# Patient Record
Sex: Female | Born: 1935 | Race: Black or African American | Hispanic: No | Marital: Single | Smoking: Never smoker
Health system: Southern US, Community
[De-identification: ages and names within clinical notes are randomized; demographics above are authoritative.]

## PROBLEM LIST (undated history)

## (undated) DIAGNOSIS — M40209 Unspecified kyphosis, site unspecified: Secondary | ICD-10-CM

## (undated) DIAGNOSIS — F0150 Vascular dementia without behavioral disturbance: Secondary | ICD-10-CM

## (undated) DIAGNOSIS — F0280 Dementia in other diseases classified elsewhere without behavioral disturbance: Secondary | ICD-10-CM

## (undated) DIAGNOSIS — K5900 Constipation, unspecified: Secondary | ICD-10-CM

## (undated) DIAGNOSIS — E559 Vitamin D deficiency, unspecified: Secondary | ICD-10-CM

## (undated) DIAGNOSIS — K5901 Slow transit constipation: Secondary | ICD-10-CM

## (undated) DIAGNOSIS — G309 Alzheimer's disease, unspecified: Secondary | ICD-10-CM

## (undated) HISTORY — DX: Slow transit constipation: K59.01

## (undated) HISTORY — DX: Vascular dementia, unspecified severity, without behavioral disturbance, psychotic disturbance, mood disturbance, and anxiety: F01.50

## (undated) HISTORY — DX: Vitamin D deficiency, unspecified: E55.9

## (undated) HISTORY — DX: Constipation, unspecified: K59.00

---

## 1997-09-04 ENCOUNTER — Emergency Department (HOSPITAL_COMMUNITY): Admission: EM | Admit: 1997-09-04 | Discharge: 1997-09-04 | Payer: Self-pay | Admitting: Emergency Medicine

## 1998-10-26 ENCOUNTER — Encounter: Payer: Self-pay | Admitting: Emergency Medicine

## 1998-10-26 ENCOUNTER — Emergency Department (HOSPITAL_COMMUNITY): Admission: EM | Admit: 1998-10-26 | Discharge: 1998-10-26 | Payer: Self-pay | Admitting: Emergency Medicine

## 1999-04-18 ENCOUNTER — Emergency Department (HOSPITAL_COMMUNITY): Admission: EM | Admit: 1999-04-18 | Discharge: 1999-04-18 | Payer: Self-pay | Admitting: Emergency Medicine

## 1999-04-18 ENCOUNTER — Encounter: Payer: Self-pay | Admitting: Emergency Medicine

## 1999-07-06 ENCOUNTER — Emergency Department (HOSPITAL_COMMUNITY): Admission: EM | Admit: 1999-07-06 | Discharge: 1999-07-06 | Payer: Self-pay | Admitting: Emergency Medicine

## 1999-07-06 ENCOUNTER — Encounter: Payer: Self-pay | Admitting: Emergency Medicine

## 1999-07-07 ENCOUNTER — Ambulatory Visit (HOSPITAL_COMMUNITY): Admission: RE | Admit: 1999-07-07 | Discharge: 1999-07-07 | Payer: Self-pay | Admitting: Emergency Medicine

## 2005-10-15 ENCOUNTER — Emergency Department (HOSPITAL_COMMUNITY): Admission: EM | Admit: 2005-10-15 | Discharge: 2005-10-16 | Payer: Self-pay | Admitting: Emergency Medicine

## 2005-11-17 ENCOUNTER — Emergency Department (HOSPITAL_COMMUNITY): Admission: EM | Admit: 2005-11-17 | Discharge: 2005-11-17 | Payer: Self-pay | Admitting: Emergency Medicine

## 2006-09-26 ENCOUNTER — Emergency Department (HOSPITAL_COMMUNITY): Admission: EM | Admit: 2006-09-26 | Discharge: 2006-09-26 | Payer: Self-pay | Admitting: Emergency Medicine

## 2007-08-10 ENCOUNTER — Emergency Department (HOSPITAL_COMMUNITY): Admission: EM | Admit: 2007-08-10 | Discharge: 2007-08-10 | Payer: Self-pay | Admitting: Emergency Medicine

## 2009-11-07 ENCOUNTER — Emergency Department (HOSPITAL_COMMUNITY): Admission: EM | Admit: 2009-11-07 | Discharge: 2009-11-07 | Payer: Self-pay | Admitting: Emergency Medicine

## 2009-11-08 ENCOUNTER — Emergency Department (HOSPITAL_COMMUNITY): Admission: EM | Admit: 2009-11-08 | Discharge: 2009-11-08 | Payer: Self-pay | Admitting: Emergency Medicine

## 2010-03-18 ENCOUNTER — Emergency Department (HOSPITAL_COMMUNITY)
Admission: EM | Admit: 2010-03-18 | Discharge: 2010-03-18 | Disposition: A | Discharge status: Home / Self Care | Payer: PRIVATE HEALTH INSURANCE | Attending: Emergency Medicine | Admitting: Emergency Medicine

## 2010-03-18 DIAGNOSIS — G309 Alzheimer's disease, unspecified: Secondary | ICD-10-CM | POA: Insufficient documentation

## 2010-03-18 DIAGNOSIS — Y9210 Unspecified residential institution as the place of occurrence of the external cause: Secondary | ICD-10-CM | POA: Insufficient documentation

## 2010-03-18 DIAGNOSIS — W010XXA Fall on same level from slipping, tripping and stumbling without subsequent striking against object, initial encounter: Secondary | ICD-10-CM | POA: Insufficient documentation

## 2010-03-18 DIAGNOSIS — F0280 Dementia in other diseases classified elsewhere without behavioral disturbance: Secondary | ICD-10-CM | POA: Insufficient documentation

## 2010-03-18 DIAGNOSIS — I10 Essential (primary) hypertension: Secondary | ICD-10-CM | POA: Insufficient documentation

## 2010-03-18 DIAGNOSIS — T1490XA Injury, unspecified, initial encounter: Secondary | ICD-10-CM | POA: Insufficient documentation

## 2010-11-04 ENCOUNTER — Emergency Department (HOSPITAL_COMMUNITY): Payer: Medicare Other

## 2010-11-04 ENCOUNTER — Emergency Department (HOSPITAL_COMMUNITY)
Admission: EM | Admit: 2010-11-04 | Discharge: 2010-11-04 | Disposition: A | Discharge status: Home / Self Care | Payer: Medicare Other | Attending: Emergency Medicine | Admitting: Emergency Medicine

## 2010-11-04 DIAGNOSIS — Z79899 Other long term (current) drug therapy: Secondary | ICD-10-CM | POA: Insufficient documentation

## 2010-11-04 DIAGNOSIS — E860 Dehydration: Secondary | ICD-10-CM | POA: Insufficient documentation

## 2010-11-04 DIAGNOSIS — F0280 Dementia in other diseases classified elsewhere without behavioral disturbance: Secondary | ICD-10-CM | POA: Insufficient documentation

## 2010-11-04 DIAGNOSIS — G309 Alzheimer's disease, unspecified: Secondary | ICD-10-CM | POA: Insufficient documentation

## 2010-11-04 DIAGNOSIS — M549 Dorsalgia, unspecified: Secondary | ICD-10-CM | POA: Insufficient documentation

## 2010-11-04 DIAGNOSIS — M542 Cervicalgia: Secondary | ICD-10-CM | POA: Insufficient documentation

## 2010-11-04 DIAGNOSIS — N390 Urinary tract infection, site not specified: Secondary | ICD-10-CM | POA: Insufficient documentation

## 2010-11-04 LAB — CULTURE, BLOOD (ROUTINE X 2): Culture: NO GROWTH

## 2010-11-04 LAB — DIFFERENTIAL
Basophils Absolute: 0
Basophils Relative: 1
Basophils Relative: 0 % (ref 0–1)
Eosinophils Absolute: 0.1
Eosinophils Absolute: 0 10*3/uL (ref 0.0–0.7)
Eosinophils Relative: 1
Lymphocytes Relative: 19
Monocytes Absolute: 0.5
Monocytes Absolute: 0.2 10*3/uL (ref 0.1–1.0)
Monocytes Relative: 4 % (ref 3–12)

## 2010-11-04 LAB — URINALYSIS, ROUTINE W REFLEX MICROSCOPIC
Glucose, UA: NEGATIVE
Hgb urine dipstick: NEGATIVE
Protein, ur: NEGATIVE
Protein, ur: NEGATIVE mg/dL
Urobilinogen, UA: 1
Urobilinogen, UA: 1 mg/dL (ref 0.0–1.0)

## 2010-11-04 LAB — BASIC METABOLIC PANEL
Calcium: 9.6 mg/dL (ref 8.4–10.5)
GFR calc non Af Amer: 75 mL/min — ABNORMAL LOW (ref >90)
Glucose, Bld: 99 mg/dL (ref 70–99)
Sodium: 138 mEq/L (ref 135–145)

## 2010-11-04 LAB — CBC
HCT: 34.7 — ABNORMAL LOW
Hemoglobin: 11.5 — ABNORMAL LOW
MCH: 25.7 pg — ABNORMAL LOW (ref 26.0–34.0)
MCHC: 33.2
MCHC: 31.5 g/dL (ref 30.0–36.0)
MCV: 78.1
Platelets: 166 10*3/uL (ref 150–400)
RDW: 14.1 — ABNORMAL HIGH

## 2010-11-04 LAB — I-STAT 8, (EC8 V) (CONVERTED LAB)
Glucose, Bld: 86
Potassium: 4.2
TCO2: 28
pH, Ven: 7.378 — ABNORMAL HIGH

## 2010-11-04 LAB — HEPATIC FUNCTION PANEL
ALT: 17
Bilirubin, Direct: 0.2
Indirect Bilirubin: 1.1 — ABNORMAL HIGH

## 2010-11-04 LAB — URINE MICROSCOPIC-ADD ON

## 2010-11-04 LAB — POCT I-STAT CREATININE: Operator id: 234501

## 2010-11-06 LAB — URINE CULTURE
Colony Count: >=100000
Culture  Setup Time: 201210130053

## 2011-03-01 ENCOUNTER — Encounter (HOSPITAL_COMMUNITY): Payer: Self-pay | Admitting: Emergency Medicine

## 2011-03-01 ENCOUNTER — Emergency Department (HOSPITAL_COMMUNITY): Payer: Medicare Other

## 2011-03-01 ENCOUNTER — Emergency Department (HOSPITAL_COMMUNITY)
Admission: EM | Admit: 2011-03-01 | Discharge: 2011-03-01 | Disposition: A | Discharge status: Skilled Nursing Facility | Payer: Medicare Other | Attending: Family Medicine | Admitting: Family Medicine

## 2011-03-01 ENCOUNTER — Other Ambulatory Visit: Payer: Self-pay

## 2011-03-01 DIAGNOSIS — F0280 Dementia in other diseases classified elsewhere without behavioral disturbance: Secondary | ICD-10-CM | POA: Insufficient documentation

## 2011-03-01 DIAGNOSIS — R5383 Other fatigue: Secondary | ICD-10-CM | POA: Insufficient documentation

## 2011-03-01 DIAGNOSIS — W19XXXA Unspecified fall, initial encounter: Secondary | ICD-10-CM

## 2011-03-01 DIAGNOSIS — R5381 Other malaise: Secondary | ICD-10-CM | POA: Insufficient documentation

## 2011-03-01 DIAGNOSIS — G309 Alzheimer's disease, unspecified: Secondary | ICD-10-CM | POA: Insufficient documentation

## 2011-03-01 HISTORY — DX: Dementia in other diseases classified elsewhere, unspecified severity, without behavioral disturbance, psychotic disturbance, mood disturbance, and anxiety: F02.80

## 2011-03-01 HISTORY — DX: Alzheimer's disease, unspecified: G30.9

## 2011-03-01 LAB — CBC
HCT: 35.5 % — ABNORMAL LOW (ref 36.0–46.0)
Hemoglobin: 11.4 g/dL — ABNORMAL LOW (ref 12.0–15.0)
RBC: 4.38 MIL/uL (ref 3.87–5.11)
WBC: 6.9 10*3/uL (ref 4.0–10.5)

## 2011-03-01 LAB — URINALYSIS, ROUTINE W REFLEX MICROSCOPIC
Glucose, UA: NEGATIVE mg/dL
Leukocytes, UA: NEGATIVE
pH: 5.5 (ref 5.0–8.0)

## 2011-03-01 LAB — GLUCOSE, CAPILLARY: Glucose-Capillary: 123 mg/dL — ABNORMAL HIGH (ref 70–99)

## 2011-03-01 LAB — CARDIAC PANEL(CRET KIN+CKTOT+MB+TROPI): Relative Index: 2.5 (ref 0.0–2.5)

## 2011-03-01 LAB — COMPREHENSIVE METABOLIC PANEL
Alkaline Phosphatase: 58 U/L (ref 39–117)
BUN: 20 mg/dL (ref 6–23)
Chloride: 102 mEq/L (ref 96–112)
GFR calc Af Amer: 85 mL/min — ABNORMAL LOW (ref >90)
Glucose, Bld: 118 mg/dL — ABNORMAL HIGH (ref 70–99)
Potassium: 4.2 mEq/L (ref 3.5–5.1)
Total Bilirubin: 0.4 mg/dL (ref 0.3–1.2)

## 2011-03-01 NOTE — ED Notes (Addendum)
Per EMS.  Pt resident of [facility] and pt has Alzheimers.  Staff stated that pt looked like she was weak while walking in the hallway, so staff assisted her to the floor.  When EMS arrived pt was asleep on floor.  Pt does not complain of pain or sob.  Pt only spoke of pocketbook.  Pt has DNR.

## 2011-03-01 NOTE — ED Notes (Signed)
CBG 123 

## 2011-03-01 NOTE — ED Provider Notes (Signed)
History     CSN: [id number]  Arrival date & time 03/01/11  1233   First MD Initiated Contact with Patient 03/01/11 1238      Chief Complaint  Patient presents with  . Weakness   Patient is a 78 y.o. female presenting with weakness. The history is provided by the patient and the EMS personnel.  Weakness The symptoms began 1 to 2 hours ago. The symptoms are unchanged. The neurological symptoms are diffuse.  Additional symptoms include weakness.  The patient was reportedly walking down the hall, and looked weak, so staff helped her to the floor.  She then seemed to be asleep.  EMS was called.    The patient is able to state she feels weak, but is otherwise unable to provide any history.     Past Medical History  Diagnosis Date  . Dementia in Alzheimer's disease     History reviewed. No pertinent past surgical history.on file.  Unable to obtain further history due to dementia.   History reviewed. No pertinent family history. on file.  Unable to obtain further history due to dementia.   History  Substance Use Topics  . Smoking status: Unknown If Ever Smoked  . Smokeless tobacco: Not on file  . Alcohol Use: No    Review of Systems  Neurological: Positive for weakness.  Otherwise unable to obtain due to dementia.   Allergies  Review of patient's allergies indicates no known allergies.  Home Medications  No current outpatient prescriptions on file.  There were no vitals taken for this visit.  Physical Exam  Constitutional: She is easily aroused. No distress.       Patient sitting with head down, appears to be asleep.   HENT:  Head: Normocephalic.  Eyes: EOM are normal. Pupils are equal, round, and reactive to light.  Neck: Normal range of motion. Neck supple.  Cardiovascular: Normal rate, regular rhythm and normal heart sounds.   Pulmonary/Chest: Effort normal and breath sounds normal.  Abdominal: Soft. Bowel sounds are normal.  Musculoskeletal: She exhibits no edema.   Neurological: She is easily aroused. She is disoriented.  No focal neuro deficits.   ED Course  Procedures (including critical care time)  Date: 03/01/2011  Rate: 64  Rhythm: normal sinus rhythm  QRS Axis: normal  Intervals: normal  ST/T Wave abnormalities: nonspecific ST changes  Conduction Disutrbances:none  Narrative Interpretation: NSR, LVH.   Old EKG Reviewed: unchanged   Labs Reviewed  CBC - Abnormal; Notable for the following:    Hemoglobin 11.4 (*)    HCT 35.5 (*)    Platelets 147 (*)    All other components within normal limits  COMPREHENSIVE METABOLIC PANEL - Abnormal; Notable for the following:    Glucose, Bld 118 (*)    GFR calc non Af Amer 73 (*)    GFR calc Af Amer 85 (*)    All other components within normal limits  GLUCOSE, CAPILLARY - Abnormal; Notable for the following:    Glucose-Capillary 123 (*)    All other components within normal limits  URINALYSIS, ROUTINE W REFLEX MICROSCOPIC  CARDIAC PANEL(CRET KIN+CKTOT+MB+TROPI)   Ct Head Wo Contrast  03/01/2011  *RADIOLOGY REPORT*  Clinical Data: Weakness, confusion  CT HEAD WITHOUT CONTRAST  Technique:  Contiguous axial images were obtained from the base of the skull through the vertex without contrast.  Comparison: 10/25/2010  Findings: No skull fracture is noted.  Paranasal sinuses and mastoid air cells are unremarkable.  Stable cerebral atrophy.  Atherosclerotic  calcifications of carotid siphon are again noted.  No intracranial hemorrhage, mass effect or midline shift.  Small lacunar infarct in the left putamen is stable.  Stable periventricular and subcortical chronic white matter decreased attenuation probable due to small cell ischemic changes.  No acute infarction.  No mass lesion is noted on this unenhanced scan.  IMPRESSION: No acute intracranial abnormality.  Stable atrophy and chronic white matter disease.  Original Report Authenticated By: Natasha Mead, M.D.   Dg Chest Port 1 View  03/01/2011  *RADIOLOGY  REPORT*  Clinical Data: Weakness, some confusion  PORTABLE CHEST - 1 VIEW  Comparison: Chest x-ray of 11/04/2010  Findings: No active infiltrate or effusion is seen.  Slightly prominent interstitial markings appear chronic.  The heart is mildly enlarged and stable.  The bones are osteopenic and there are diffuse degenerative changes throughout the thoracic spine.  IMPRESSION: Stable chronic change and cardiomegaly.  No active lung disease.  Original Report Authenticated By: Juline Patch, M.D.     1. Fall       MDM  Patient presents from [facility] Alzheimers unit with weakness, she has severe baseline dementia. - Exam non-focal, CT head negative -CXR and UA, WBC not concerning for infection - EKG unchanged from previous.  -Discharge back to [facility]         Ardyth Gal, MD 03/01/11 1520

## 2011-03-02 NOTE — ED Provider Notes (Signed)
I saw and evaluated the patient, reviewed the resident's note and I agree with the findings and plan.  I saw the patient along with Dr. Lula Olszewski.  I agree with her note, assessment, and plan.  The patient was transported here for evaluation after some sort of fall or syncopal episode at the nursing home.  The physical exam revealed a confused, demented, elderly female in no distress.  She was verbal, but confused.  The heart and lung exam were unremarkable and neurologic exam was non-focal, but limited due to dementia.  The labs were essentially unremarkable and head CT was okay.  We will discharge her back to the ecf.    Geoffery Lyons, MD 03/02/11 1356

## 2011-07-31 ENCOUNTER — Emergency Department (HOSPITAL_COMMUNITY)
Admission: EM | Admit: 2011-07-31 | Discharge: 2011-07-31 | Disposition: A | Discharge status: Skilled Nursing Facility | Payer: Medicare Other | Attending: Emergency Medicine | Admitting: Emergency Medicine

## 2011-07-31 ENCOUNTER — Emergency Department (HOSPITAL_COMMUNITY): Payer: Medicare Other

## 2011-07-31 ENCOUNTER — Encounter (HOSPITAL_COMMUNITY): Payer: Self-pay | Admitting: Emergency Medicine

## 2011-07-31 DIAGNOSIS — Z79899 Other long term (current) drug therapy: Secondary | ICD-10-CM | POA: Insufficient documentation

## 2011-07-31 DIAGNOSIS — W19XXXA Unspecified fall, initial encounter: Secondary | ICD-10-CM

## 2011-07-31 DIAGNOSIS — M546 Pain in thoracic spine: Secondary | ICD-10-CM | POA: Insufficient documentation

## 2011-07-31 DIAGNOSIS — G309 Alzheimer's disease, unspecified: Secondary | ICD-10-CM | POA: Insufficient documentation

## 2011-07-31 DIAGNOSIS — F0280 Dementia in other diseases classified elsewhere without behavioral disturbance: Secondary | ICD-10-CM | POA: Insufficient documentation

## 2011-07-31 DIAGNOSIS — M542 Cervicalgia: Secondary | ICD-10-CM

## 2011-07-31 DIAGNOSIS — W010XXA Fall on same level from slipping, tripping and stumbling without subsequent striking against object, initial encounter: Secondary | ICD-10-CM | POA: Insufficient documentation

## 2011-07-31 NOTE — ED Notes (Signed)
Pt brought in by EMS for [facility] s/p fall while using her walker going to dining room  To eat , Denies dizziness and loc c/o back pain neck pain

## 2011-07-31 NOTE — ED Provider Notes (Signed)
History    89yF with neck pain. Fell while ambulating with walker. Lost balance. Not sure if she hit her head. No LOC. C/o neck and upper back pain. No HA. No acute visual changes. No numbness, tingling or loss of strength. No n/v. No CP. No abdominal pain. No blood thinners. Per report, appears to be at her baseline mental status.  CSN: [id number]  Arrival date & time 07/31/11  1235   First MD Initiated Contact with Patient 07/31/11 1358      Chief Complaint  Patient presents with  . Fall    (Consider location/radiation/quality/duration/timing/severity/associated sxs/prior treatment) HPI  Past Medical History  Diagnosis Date  . Dementia in Alzheimer's disease     No past surgical history on file.  No family history on file.  History  Substance Use Topics  . Smoking status: Unknown If Ever Smoked  . Smokeless tobacco: Not on file  . Alcohol Use: No    OB History    Grav Para Term Preterm Abortions TAB SAB Ect Mult Living                  Review of Systems   Review of symptoms negative unless otherwise noted in HPI.   Allergies  Review of patient's allergies indicates no known allergies.  Home Medications   Current Outpatient Rx  Name Route Sig Dispense Refill  . ACETAMINOPHEN 325 MG PO TABS Oral Take 650 mg by mouth every 6 (six) hours as needed. For pain.    Marland Kitchen DOCUSATE SODIUM 100 MG PO CAPS Oral Take 100 mg by mouth 2 (two) times daily.    . DONEPEZIL HCL 10 MG PO TABS Oral Take 10 mg by mouth at bedtime.    [place] IMMUNE HEALTH PO LIQD Oral Take 237 mLs by mouth 2 (two) times daily.    Marland Kitchen LORATADINE 10 MG PO TABS Oral Take 10 mg by mouth daily.    Marland Kitchen MEMANTINE HCL 10 MG PO TABS Oral Take 10 mg by mouth 2 (two) times daily.    . OLOPATADINE HCL 0.1 % OP SOLN Both Eyes Place 1 drop into both eyes 2 (two) times daily.    Marland Kitchen POLYETHYLENE GLYCOL 3350 PO PACK Oral Take 17 g by mouth daily.    Marland Kitchen VITAMIN D (ERGOCALCIFEROL) 50000 UNITS PO CAPS Oral Take 50,000 Units  by mouth every 30 (thirty) days.      BP 104/62  Pulse 84  Temp 98.7 F (37.1 C) (Oral)  Resp 12  SpO2 100%  Physical Exam  Nursing note and vitals reviewed. Constitutional: No distress.       Laying in bed. NAD. No external signs of trauma.  HENT:  Head: Normocephalic and atraumatic.  Eyes: Conjunctivae are normal. Pupils are equal, round, and reactive to light. Right eye exhibits no discharge. Left eye exhibits no discharge.  Cardiovascular: Normal rate, regular rhythm and normal heart sounds.  Exam reveals no gallop and no friction rub.   No murmur heard. Pulmonary/Chest: Effort normal and breath sounds normal. No respiratory distress.  Abdominal: Soft. She exhibits no distension. There is no tenderness.  Musculoskeletal: She exhibits no edema and no tenderness.       No midline spinal tenderness.  Neurological:       Does follow basic commands. Disoriented to place and time. No deficits from described baseline.  Skin: Skin is warm and dry.  Psychiatric: She has a normal mood and affect. Her behavior is normal. Thought content normal.  ED Course  Procedures (including critical care time)  Labs Reviewed - No data to display Ct Head Wo Contrast  07/31/2011  *RADIOLOGY REPORT*  Clinical Data:  Fall.  Dementia  CT HEAD WITHOUT CONTRAST CT CERVICAL SPINE WITHOUT CONTRAST  Technique:  Multidetector CT imaging of the head and cervical spine was performed following the standard protocol without intravenous contrast.  Multiplanar CT image reconstructions of the cervical spine were also generated.  Comparison:  CT head 03/01/2011  CT HEAD  Findings: Generalized atrophy.  Chronic microvascular ischemia in the white matter.  Chronic lacunar infarction or benign cyst in the left basal ganglia is unchanged.  Negative for acute infarct. Negative for hemorrhage or mass.  Negative for skull fracture. Mild chronic sinusitis.  IMPRESSION: Atrophy and chronic microvascular ischemia.  No acute  abnormality.  CT CERVICAL SPINE  Findings: Schmorl's node in the superior endplate of T1 with mild loss of height.  This appears chronic.  Negative for acute fracture.  Disc degeneration and spondylosis C4-5 and C5-6.  Degenerative changes at C1-2.  Cervical facet degeneration is present.  Mild dextroscoliosis.  IMPRESSION: Negative for acute fracture.  Mild fracture of T1 appears chronic.  Original Report Authenticated By: Camelia Phenes, M.D.   Ct Cervical Spine Wo Contrast  07/31/2011  *RADIOLOGY REPORT*  Clinical Data:  Fall.  Dementia  CT HEAD WITHOUT CONTRAST CT CERVICAL SPINE WITHOUT CONTRAST  Technique:  Multidetector CT imaging of the head and cervical spine was performed following the standard protocol without intravenous contrast.  Multiplanar CT image reconstructions of the cervical spine were also generated.  Comparison:  CT head 03/01/2011  CT HEAD  Findings: Generalized atrophy.  Chronic microvascular ischemia in the white matter.  Chronic lacunar infarction or benign cyst in the left basal ganglia is unchanged.  Negative for acute infarct. Negative for hemorrhage or mass.  Negative for skull fracture. Mild chronic sinusitis.  IMPRESSION: Atrophy and chronic microvascular ischemia.  No acute abnormality.  CT CERVICAL SPINE  Findings: Schmorl's node in the superior endplate of T1 with mild loss of height.  This appears chronic.  Negative for acute fracture.  Disc degeneration and spondylosis C4-5 and C5-6.  Degenerative changes at C1-2.  Cervical facet degeneration is present.  Mild dextroscoliosis.  IMPRESSION: Negative for acute fracture.  Mild fracture of T1 appears chronic.  Original Report Authenticated By: Camelia Phenes, M.D.     1. Fall   2. Neck pain       MDM  89yf s/p fall. Per report, mechanical in nature. Pt seems to be at her baseline. Imaging negative for acute traumatic injury. Pt is from SNF. Feel she is safe for DC back to monitored setting at this time.          Raeford Razor, MD 08/05/11 1606

## 2011-07-31 NOTE — ED Notes (Signed)
Bed:WA25<BR> Expected date:<BR> Expected time:<BR> Means of arrival:<BR> Comments:<BR> fall

## 2011-07-31 NOTE — ED Notes (Signed)
[facility] called for transportation  

## 2011-10-27 ENCOUNTER — Emergency Department (HOSPITAL_COMMUNITY)
Admission: EM | Admit: 2011-10-27 | Discharge: 2011-10-27 | Disposition: A | Discharge status: Skilled Nursing Facility | Payer: Medicare Other | Attending: Emergency Medicine | Admitting: Emergency Medicine

## 2011-10-27 ENCOUNTER — Emergency Department (HOSPITAL_COMMUNITY): Payer: Medicare Other

## 2011-10-27 ENCOUNTER — Encounter (HOSPITAL_COMMUNITY): Payer: Self-pay

## 2011-10-27 DIAGNOSIS — F0280 Dementia in other diseases classified elsewhere without behavioral disturbance: Secondary | ICD-10-CM | POA: Insufficient documentation

## 2011-10-27 DIAGNOSIS — W010XXA Fall on same level from slipping, tripping and stumbling without subsequent striking against object, initial encounter: Secondary | ICD-10-CM | POA: Insufficient documentation

## 2011-10-27 DIAGNOSIS — G309 Alzheimer's disease, unspecified: Secondary | ICD-10-CM | POA: Insufficient documentation

## 2011-10-27 DIAGNOSIS — Y9210 Unspecified residential institution as the place of occurrence of the external cause: Secondary | ICD-10-CM | POA: Insufficient documentation

## 2011-10-27 DIAGNOSIS — M79609 Pain in unspecified limb: Secondary | ICD-10-CM | POA: Insufficient documentation

## 2011-10-27 DIAGNOSIS — Z79899 Other long term (current) drug therapy: Secondary | ICD-10-CM | POA: Insufficient documentation

## 2011-10-27 DIAGNOSIS — S93409A Sprain of unspecified ligament of unspecified ankle, initial encounter: Secondary | ICD-10-CM | POA: Insufficient documentation

## 2011-10-27 NOTE — ED Notes (Signed)
Per ems, pt from nursing home, staff sts pt was walking, had a unwitnessed fall from standing position, leg gave out on her, left foot pain, no neck or back pain, no skin injury.

## 2011-10-27 NOTE — ED Notes (Signed)
[facility] called  

## 2011-10-27 NOTE — ED Provider Notes (Signed)
History     CSN: [id number]  Arrival date & time 10/27/11  1505   First MD Initiated Contact with Patient 10/27/11 1600      Chief Complaint  Patient presents with  . Fall  . Foot Pain    left     (Consider location/radiation/quality/duration/timing/severity/associated sxs/prior treatment) HPI  78 y.o. demented female sent from nursing home for evaluation of the left foot pain after slip and fall earlier in the day. 5 caveat secondary to dementia.   Past Medical History  Diagnosis Date  . Dementia in Alzheimer's disease     No past surgical history on file.  No family history on file.  History  Substance Use Topics  . Smoking status: Unknown If Ever Smoked  . Smokeless tobacco: Not on file  . Alcohol Use: No    OB History    Grav Para Term Preterm Abortions TAB SAB Ect Mult Living                  Review of Systems  Unable to perform ROS: Dementia    Allergies  Review of patient's allergies indicates no known allergies.  Home Medications   Current Outpatient Rx  Name Route Sig Dispense Refill  . ACETAMINOPHEN 325 MG PO TABS Oral Take 650 mg by mouth every 6 (six) hours as needed. For pain.    Marland Kitchen DOCUSATE SODIUM 100 MG PO CAPS Oral Take 100 mg by mouth 2 (two) times daily.    . DONEPEZIL HCL 10 MG PO TABS Oral Take 10 mg by mouth at bedtime.    [place] IMMUNE HEALTH PO LIQD Oral Take 237 mLs by mouth 2 (two) times daily.    Marland Kitchen LORATADINE 10 MG PO TABS Oral Take 10 mg by mouth daily.    Marland Kitchen MEMANTINE HCL 10 MG PO TABS Oral Take 10 mg by mouth 2 (two) times daily.    . OLOPATADINE HCL 0.1 % OP SOLN Both Eyes Place 1 drop into both eyes every evening.     Marland Kitchen POLYETHYLENE GLYCOL 3350 PO PACK Oral Take 17 g by mouth daily.    Marland Kitchen VITAMIN D (ERGOCALCIFEROL) 50000 UNITS PO CAPS Oral Take 50,000 Units by mouth every 30 (thirty) days.      BP 126/70  Pulse 94  Temp 98 F (36.7 C) (Oral)  Resp 16  SpO2 94%  Physical Exam  Nursing note and vitals  reviewed. Constitutional: She appears well-developed and well-nourished. No distress.  HENT:  Head: Normocephalic and atraumatic.  Right Ear: External ear normal.  Mouth/Throat: Oropharynx is clear and moist.  Eyes: Conjunctivae normal and EOM are normal. Pupils are equal, round, and reactive to light.  Neck: Normal range of motion.  Cardiovascular: Normal rate, regular rhythm and intact distal pulses.   Pulmonary/Chest: Effort normal and breath sounds normal. No stridor.  Abdominal: Soft. Bowel sounds are normal. She exhibits no distension and no mass. There is no tenderness. There is no rebound and no guarding.  Musculoskeletal: Normal range of motion.       No deformity noted to left foot. Patient shows no sign of tenderness to deep palpation of all surfaces. Dorsalis pedis 2+ bilaterally and cap refill less than 3 seconds.  Neurological:       Demented intermittent words but essentially nonverbal  Psychiatric: She has a normal mood and affect.    ED Course  Procedures (including critical care time)  Labs Reviewed - No data to display Dg Foot Complete Left  10/27/2011  *RADIOLOGY REPORT*  Clinical Data: Fall, anterior foot pain  LEFT FOOT - COMPLETE 3+ VIEW  Comparison: 11/07/2009  Findings: Three views of the left foot submitted.  No acute fracture or subluxation.  Study is markedly limited by diffuse osteopenia.  Again noted plantar spurring of the calcaneus.  Stable posterior spurring of the calcaneus at the Achilles tendon insertion.  IMPRESSION: No acute fracture or subluxation.  Markedly limited study by diffuse osteopenia.  Stable posterior and plantar spurring of the calcaneus.   Original Report Authenticated By: Natasha Mead, M.D.      1. Ankle sprain       MDM  No signs of trauma on physical exam. X-ray shows no acute bony abnormalities. We'll recommend RICE          Wynetta Emery, PA-C 10/27/11 1654

## 2011-10-27 NOTE — ED Notes (Signed)
Pt transported back with [facility].

## 2011-10-27 NOTE — ED Notes (Signed)
Report given to [facility].

## 2011-10-27 NOTE — ED Notes (Signed)
Pt transported back to [facility] by [employer]

## 2011-10-27 NOTE — ED Notes (Signed)
Received report from [place], Charity fundraiser. Pt waiting on [facility] to transfer her back to NH. Pt sitting in chair. Pt given sandwich and juice.

## 2011-10-31 NOTE — ED Provider Notes (Signed)
Medical screening examination/treatment/procedure(s) were performed by non-physician practitioner and as supervising physician I was immediately available for consultation/collaboration.  Asalee Barrette, MD 10/31/11 2348 

## 2011-11-27 ENCOUNTER — Encounter (HOSPITAL_COMMUNITY): Payer: Self-pay | Admitting: *Deleted

## 2011-11-27 ENCOUNTER — Emergency Department (HOSPITAL_COMMUNITY)
Admission: EM | Admit: 2011-11-27 | Discharge: 2011-11-27 | Disposition: A | Discharge status: Skilled Nursing Facility | Payer: Medicare Other | Attending: Emergency Medicine | Admitting: Emergency Medicine

## 2011-11-27 DIAGNOSIS — Z0000 Encounter for general adult medical examination without abnormal findings: Secondary | ICD-10-CM

## 2011-11-27 DIAGNOSIS — F0280 Dementia in other diseases classified elsewhere without behavioral disturbance: Secondary | ICD-10-CM | POA: Insufficient documentation

## 2011-11-27 DIAGNOSIS — W1789XA Other fall from one level to another, initial encounter: Secondary | ICD-10-CM | POA: Insufficient documentation

## 2011-11-27 DIAGNOSIS — Z043 Encounter for examination and observation following other accident: Secondary | ICD-10-CM | POA: Insufficient documentation

## 2011-11-27 DIAGNOSIS — Z79899 Other long term (current) drug therapy: Secondary | ICD-10-CM | POA: Insufficient documentation

## 2011-11-27 DIAGNOSIS — W19XXXA Unspecified fall, initial encounter: Secondary | ICD-10-CM

## 2011-11-27 DIAGNOSIS — Y9210 Unspecified residential institution as the place of occurrence of the external cause: Secondary | ICD-10-CM | POA: Insufficient documentation

## 2011-11-27 DIAGNOSIS — G309 Alzheimer's disease, unspecified: Secondary | ICD-10-CM | POA: Insufficient documentation

## 2011-11-27 DIAGNOSIS — Y939 Activity, unspecified: Secondary | ICD-10-CM | POA: Insufficient documentation
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
## 2011-11-27 NOTE — ED Notes (Signed)
[facility] called to transport pt back to nursing facility.

## 2011-11-27 NOTE — ED Notes (Signed)
Facility notified of patents return
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
## 2011-11-27 NOTE — ED Notes (Signed)
ZOX:[id number]<AV> Expected date:11/27/11<BR> Expected time:10:05 PM<BR> Means of arrival:Ambulance<BR> Comments:<BR> 78 yo fall

## 2012-02-23 ENCOUNTER — Emergency Department (HOSPITAL_COMMUNITY): Payer: Medicare Other

## 2012-02-23 ENCOUNTER — Emergency Department (HOSPITAL_COMMUNITY)
Admission: EM | Admit: 2012-02-23 | Discharge: 2012-02-23 | Disposition: A | Discharge status: Home / Self Care | Payer: Medicare Other | Attending: Emergency Medicine | Admitting: Emergency Medicine

## 2012-02-23 ENCOUNTER — Encounter (HOSPITAL_COMMUNITY): Payer: Self-pay | Admitting: Emergency Medicine

## 2012-02-23 DIAGNOSIS — R5381 Other malaise: Secondary | ICD-10-CM | POA: Insufficient documentation

## 2012-02-23 DIAGNOSIS — F0280 Dementia in other diseases classified elsewhere without behavioral disturbance: Secondary | ICD-10-CM | POA: Insufficient documentation

## 2012-02-23 DIAGNOSIS — R531 Weakness: Secondary | ICD-10-CM

## 2012-02-23 DIAGNOSIS — G309 Alzheimer's disease, unspecified: Secondary | ICD-10-CM | POA: Insufficient documentation

## 2012-02-23 DIAGNOSIS — R5383 Other fatigue: Secondary | ICD-10-CM | POA: Insufficient documentation

## 2012-02-23 DIAGNOSIS — Z79899 Other long term (current) drug therapy: Secondary | ICD-10-CM | POA: Insufficient documentation

## 2012-02-23 DIAGNOSIS — R630 Anorexia: Secondary | ICD-10-CM | POA: Insufficient documentation

## 2012-02-23 LAB — CBC WITH DIFFERENTIAL/PLATELET
Basophils Relative: 0 % (ref 0–1)
Eosinophils Absolute: 0 10*3/uL (ref 0.0–0.7)
Eosinophils Relative: 1 % (ref 0–5)
HCT: 33 % — ABNORMAL LOW (ref 36.0–46.0)
Hemoglobin: 10.4 g/dL — ABNORMAL LOW (ref 12.0–15.0)
Lymphs Abs: 1.3 10*3/uL (ref 0.7–4.0)
MCH: 26.5 pg (ref 26.0–34.0)
MCHC: 31.5 g/dL (ref 30.0–36.0)
MCV: 84.2 fL (ref 78.0–100.0)
Monocytes Absolute: 0.3 10*3/uL (ref 0.1–1.0)
Monocytes Relative: 10 % (ref 3–12)
Neutrophils Relative %: 50 % (ref 43–77)
RBC: 3.92 MIL/uL (ref 3.87–5.11)

## 2012-02-23 LAB — COMPREHENSIVE METABOLIC PANEL
Alkaline Phosphatase: 58 U/L (ref 39–117)
BUN: 24 mg/dL — ABNORMAL HIGH (ref 6–23)
Creatinine, Ser: 0.77 mg/dL (ref 0.50–1.10)
GFR calc Af Amer: 83 mL/min — ABNORMAL LOW (ref >90)
Glucose, Bld: 70 mg/dL (ref 70–99)
Potassium: 4.2 mEq/L (ref 3.5–5.1)
Total Protein: 6.7 g/dL (ref 6.0–8.3)

## 2012-02-23 LAB — URINALYSIS, ROUTINE W REFLEX MICROSCOPIC
Ketones, ur: NEGATIVE mg/dL
Leukocytes, UA: NEGATIVE
Nitrite: NEGATIVE
Protein, ur: NEGATIVE mg/dL
pH: 6 (ref 5.0–8.0)

## 2012-02-23 LAB — POCT I-STAT TROPONIN I: Troponin i, poc: 0 ng/mL (ref 0.00–0.08)

## 2012-02-23 MED ORDER — SODIUM CHLORIDE 0.9 % IV BOLUS (SEPSIS)
500.0000 mL | Freq: Once | INTRAVENOUS | Status: AC
  Administered 2012-02-23: 500 mL via INTRAVENOUS

## 2012-02-23 MED ORDER — SODIUM CHLORIDE 0.9 % IV SOLN
INTRAVENOUS | Status: DC
  Administered 2012-02-23: 14:00:00 via INTRAVENOUS

## 2012-02-23 NOTE — ED Provider Notes (Signed)
History     CSN: [id number]  Arrival date & time 02/23/12  1052   First MD Initiated Contact with Patient 02/23/12 1206      Chief Complaint  Patient presents with  . Weakness    (Consider location/radiation/quality/duration/timing/severity/associated sxs/prior treatment) Patient is a 79 y.o. female presenting with weakness. The history is provided by the patient. The history is limited by the condition of the patient.  Weakness  Additional symptoms include weakness.   patient here complaining of weakness x3 days. Notes decreased oral intake. Denies any vomiting or diarrhea. States she does have an appetite. No cough or congestion. Weakness is persistent and nothing makes it better or worse. No treatment used prior to arrival  Past Medical History  Diagnosis Date  . Dementia in Alzheimer's disease     History reviewed. No pertinent past surgical history.  No family history on file.  History  Substance Use Topics  . Smoking status: Unknown If Ever Smoked  . Smokeless tobacco: Not on file  . Alcohol Use: No    OB History    Grav Para Term Preterm Abortions TAB SAB Ect Mult Living                  Review of Systems  Unable to perform ROS Neurological: Positive for weakness.    Allergies  Review of patient's allergies indicates no known allergies.  Home Medications   Current Outpatient Rx  Name  Route  Sig  Dispense  Refill  . ACETAMINOPHEN 325 MG PO TABS   Oral   Take 650 mg by mouth every 6 (six) hours.          . DOCUSATE SODIUM 100 MG PO CAPS   Oral   Take 100 mg by mouth 2 (two) times daily.         . DONEPEZIL HCL 10 MG PO TABS   Oral   Take 10 mg by mouth at bedtime.         Marland Kitchen LORATADINE 10 MG PO TABS   Oral   Take 10 mg by mouth daily.         Marland Kitchen MEMANTINE HCL 10 MG PO TABS   Oral   Take 10 mg by mouth 2 (two) times daily.         Marland Kitchen MIRTAZAPINE 15 MG PO TABS   Oral   Take 15 mg by mouth at bedtime.         Marland Kitchen NUTRITIONAL  SHAKE PO   Oral   Take 1 Bottle by mouth 2 (two) times daily. Mighty Shakes.         . OLOPATADINE HCL 0.2 % OP SOLN   Both Eyes   Place 1 drop into both eyes at bedtime.         Marland Kitchen POLYETHYLENE GLYCOL 3350 PO PACK   Oral   Take 17 g by mouth daily.         Marland Kitchen VITAMIN D (ERGOCALCIFEROL) 50000 UNITS PO CAPS   Oral   Take 50,000 Units by mouth every 7 (seven) days. Taken on Saturdays.           BP 134/77  Pulse 86  Temp 97.9 F (36.6 C) (Oral)  Resp 23  SpO2 96%  Physical Exam  Nursing note and vitals reviewed. Constitutional: She is oriented to person, place, and time. She appears well-developed and well-nourished.  Non-toxic appearance. No distress.  HENT:  Head: Normocephalic and atraumatic.  Eyes: Conjunctivae normal, EOM and  lids are normal. Pupils are equal, round, and reactive to light.  Neck: Normal range of motion. Neck supple. No tracheal deviation present. No mass present.  Cardiovascular: Normal rate, regular rhythm and normal heart sounds.  Exam reveals no gallop.   No murmur heard. Pulmonary/Chest: Effort normal and breath sounds normal. No stridor. No respiratory distress. She has no decreased breath sounds. She has no wheezes. She has no rhonchi. She has no rales.  Abdominal: Soft. Normal appearance and bowel sounds are normal. She exhibits no distension. There is no tenderness. There is no rebound and no CVA tenderness.  Musculoskeletal: Normal range of motion. She exhibits no edema and no tenderness.  Neurological: She is alert and oriented to person, place, and time. She has normal strength. No cranial nerve deficit or sensory deficit. GCS eye subscore is 4. GCS verbal subscore is 5. GCS motor subscore is 6.  Skin: Skin is warm and dry. No abrasion and no rash noted.  Psychiatric: Her affect is blunt. Her speech is delayed. She is slowed.    ED Course  Procedures (including critical care time)   Labs Reviewed  CBC WITH DIFFERENTIAL  COMPREHENSIVE  METABOLIC PANEL  URINALYSIS, ROUTINE W REFLEX MICROSCOPIC  URINE CULTURE   No results found.   No diagnosis found.    MDM  Patient given IV fluids and feels better. Evaluation here is negative. She is stable for discharge.       Toy Baker, MD 02/23/12 1455

## 2012-02-23 NOTE — ED Notes (Addendum)
Per EMS:Pt from [facility]. pt c/o of weakness and loss of appetite x 3 days.

## 2012-02-23 NOTE — ED Notes (Signed)
ZOX:[id number]<AV> Expected date:<BR> Expected time:<BR> Means of arrival:<BR> Comments:<BR> 89 yof GI bleed x2 days

## 2012-02-25 LAB — URINE CULTURE

## 2012-07-03 ENCOUNTER — Emergency Department (HOSPITAL_COMMUNITY)

## 2012-07-03 ENCOUNTER — Encounter (HOSPITAL_COMMUNITY): Payer: Self-pay

## 2012-07-03 ENCOUNTER — Emergency Department (HOSPITAL_COMMUNITY)
Admission: EM | Admit: 2012-07-03 | Discharge: 2012-07-03 | Disposition: A | Discharge status: Skilled Nursing Facility | Attending: Emergency Medicine | Admitting: Emergency Medicine

## 2012-07-03 DIAGNOSIS — W1809XA Striking against other object with subsequent fall, initial encounter: Secondary | ICD-10-CM | POA: Insufficient documentation

## 2012-07-03 DIAGNOSIS — G309 Alzheimer's disease, unspecified: Secondary | ICD-10-CM | POA: Insufficient documentation

## 2012-07-03 DIAGNOSIS — Y9389 Activity, other specified: Secondary | ICD-10-CM | POA: Insufficient documentation

## 2012-07-03 DIAGNOSIS — Y929 Unspecified place or not applicable: Secondary | ICD-10-CM | POA: Insufficient documentation

## 2012-07-03 DIAGNOSIS — S0990XA Unspecified injury of head, initial encounter: Secondary | ICD-10-CM | POA: Insufficient documentation

## 2012-07-03 DIAGNOSIS — W19XXXA Unspecified fall, initial encounter: Secondary | ICD-10-CM

## 2012-07-03 DIAGNOSIS — Z79899 Other long term (current) drug therapy: Secondary | ICD-10-CM | POA: Insufficient documentation

## 2012-07-03 DIAGNOSIS — F0280 Dementia in other diseases classified elsewhere without behavioral disturbance: Secondary | ICD-10-CM | POA: Insufficient documentation

## 2012-07-03 LAB — COMPREHENSIVE METABOLIC PANEL
Alkaline Phosphatase: 62 U/L (ref 39–117)
BUN: 20 mg/dL (ref 6–23)
CO2: 27 mEq/L (ref 19–32)
Chloride: 104 mEq/L (ref 96–112)
GFR calc Af Amer: 86 mL/min — ABNORMAL LOW (ref >90)
Glucose, Bld: 97 mg/dL (ref 70–99)
Potassium: 4.3 mEq/L (ref 3.5–5.1)
Total Bilirubin: 0.4 mg/dL (ref 0.3–1.2)

## 2012-07-03 LAB — CBC WITH DIFFERENTIAL/PLATELET
Hemoglobin: 10.2 g/dL — ABNORMAL LOW (ref 12.0–15.0)
Lymphs Abs: 0.9 10*3/uL (ref 0.7–4.0)
Monocytes Relative: 7 % (ref 3–12)
Neutro Abs: 2.8 10*3/uL (ref 1.7–7.7)
Neutrophils Relative %: 70 % (ref 43–77)
RBC: 3.91 MIL/uL (ref 3.87–5.11)

## 2012-07-03 LAB — URINALYSIS, ROUTINE W REFLEX MICROSCOPIC
Glucose, UA: NEGATIVE mg/dL
Ketones, ur: 15 mg/dL — AB
Leukocytes, UA: NEGATIVE
Protein, ur: NEGATIVE mg/dL

## 2012-07-03 NOTE — ED Provider Notes (Addendum)
History     CSN: [id number]  Arrival date & time 07/03/12  4098   First MD Initiated Contact with Patient 07/03/12 [phone]      Chief Complaint  Patient presents with  . Fall    (Consider location/radiation/quality/duration/timing/severity/associated sxs/prior treatment) HPI Pt lives in [facility] and fell forward while picking up laundry. Pt walks with walker. States she struck her head on the door but denies LOC. Denies neck pain. No fever, chills, CP, N/V,  abd pain, urinary symptoms, focal weakness, or sensory changes Past Medical History  Diagnosis Date  . Dementia in Alzheimer's disease     History reviewed. No pertinent past surgical history.  History reviewed. No pertinent family history.  History  Substance Use Topics  . Smoking status: Unknown If Ever Smoked  . Smokeless tobacco: Not on file  . Alcohol Use: No    OB History   Grav Para Term Preterm Abortions TAB SAB Ect Mult Living                  Review of Systems  Constitutional: Negative for fever and chills.  HENT: Negative for neck pain.   Eyes: Negative for visual disturbance.  Respiratory: Negative for shortness of breath.   Cardiovascular: Negative for chest pain.  Gastrointestinal: Negative for nausea, vomiting, abdominal pain and diarrhea.  Genitourinary: Negative for dysuria.  Musculoskeletal: Negative for myalgias and back pain.  Skin: Negative for wound.  Neurological: Negative for dizziness, syncope, weakness, light-headedness, numbness and headaches.  All other systems reviewed and are negative.    Allergies  Review of patient's allergies indicates no known allergies.  Home Medications   Current Outpatient Rx  Name  Route  Sig  Dispense  Refill  . acetaminophen (TYLENOL) 325 MG tablet   Oral   Take 650 mg by mouth every 6 (six) hours.          . docusate sodium (COLACE) 100 MG capsule   Oral   Take 100 mg by mouth 2 (two) times daily.         Marland Kitchen donepezil (ARICEPT) 10 MG tablet   Oral   Take 10 mg by mouth at bedtime.         Marland Kitchen loratadine (CLARITIN) 10 MG tablet   Oral   Take 10 mg by mouth daily.         . memantine (NAMENDA) 10 MG tablet   Oral   Take 10 mg by mouth 2 (two) times daily.         . mirtazapine (REMERON) 15 MG tablet   Oral   Take 15 mg by mouth at bedtime.         . Nutritional Supplements (NUTRITIONAL SHAKE PO)   Oral   Take 1 Bottle by mouth 2 (two) times daily. Mighty Shakes.         . Olopatadine HCl (PATADAY) 0.2 % SOLN   Both Eyes   Place 1 drop into both eyes at bedtime.         . polyethylene glycol (MIRALAX / GLYCOLAX) packet   Oral   Take 17 g by mouth daily.         . Vitamin D, Ergocalciferol, (DRISDOL) 50000 UNITS CAPS   Oral   Take 50,000 Units by mouth every 7 (seven) days. Taken on Saturdays.           BP 157/80  Pulse 76  Temp(Src) 97.8 F (36.6 C) (Oral)  Resp 15  SpO2 95%  Physical  Exam  Nursing note and vitals reviewed. Constitutional: She is oriented to person, place, and time. She appears well-developed and well-nourished. No distress.  HENT:  Head: Normocephalic and atraumatic.  Mouth/Throat: Oropharynx is clear and moist.  Eyes: EOM are normal. Pupils are equal, round, and reactive to light.  Neck: Normal range of motion. Neck supple.  No posterior cervical spine TTP  Cardiovascular: Normal rate and regular rhythm.   Pulmonary/Chest: Effort normal and breath sounds normal. No respiratory distress. She has no wheezes. She has no rales.  Abdominal: Soft. Bowel sounds are normal. She exhibits no distension and no mass. There is no tenderness. There is no rebound and no guarding.  Musculoskeletal: Normal range of motion. She exhibits no edema and no tenderness.  Neurological: She is alert and oriented to person, place, and time.  5/5 motor in all ext, sensation intact  Skin: Skin is warm and dry. No rash noted. No erythema.  Psychiatric: She has a normal mood and affect. Her behavior  is normal.    ED Course  Procedures (including critical care time)  Labs Reviewed  CBC WITH DIFFERENTIAL - Abnormal; Notable for the following:    WBC 3.9 (*)    Hemoglobin 10.2 (*)    HCT 31.9 (*)    All other components within normal limits  COMPREHENSIVE METABOLIC PANEL - Abnormal; Notable for the following:    GFR calc non Af Amer 74 (*)    GFR calc Af Amer 86 (*)    All other components within normal limits  URINALYSIS, ROUTINE W REFLEX MICROSCOPIC - Abnormal; Notable for the following:    Ketones, ur 15 (*)    All other components within normal limits   Ct Head Wo Contrast  07/03/2012   *RADIOLOGY REPORT*  Clinical Data: Lost balance and fell.  Found on floor.  Confusion and weakness.  History of dementia.  CT HEAD WITHOUT CONTRAST  Technique:  Contiguous axial images were obtained from the base of the skull through the vertex without contrast.  Comparison: 07/31/2011 most recent.  Findings: There is no evidence for acute infarction, intracranial hemorrhage, mass lesion, hydrocephalus, or extra-axial fluid.  Moderate to severe atrophy is present.  Advanced chronic microvascular ischemic change is present in the periventricular and subcortical white matter.  There is no visible skull fracture.  No significant scalp hematoma is evident.  No acute sinus or mastoid disease.  Similar appearance to priors.  IMPRESSION: Atrophy and chronic microvascular ischemic change.  No skull fracture or intracranial hemorrhage.  No visible subdural hematoma.   Original Report Authenticated By: Davonna Belling, M.D.     1. Fall, initial encounter   2. Closed head injury without concussion, initial encounter       MDM  Pt at her baseline. No acute abnormalities on her work up. Will d/c back to nursing home.         Loren Racer, MD 07/03/12 1242  Loren Racer, MD 07/22/12 [phone]

## 2012-07-03 NOTE — ED Notes (Signed)
Per EMS, Pt, from [facility], presents after an unwitnessed fall.  Denies pain.  Deneis LOC.  No deformity noted.  Pt sts lost balance and slid down a wall.  EMS sts Pt was lying in the floor upon arrival.  Hx of dementia.  A & Ox3.  NAD noted.

## 2012-07-03 NOTE — ED Notes (Signed)
ZOX:[id number]<AV> Expected date:<BR> Expected time:<BR> Means of arrival:<BR> Comments:<BR> 79yo-ems

## 2012-07-03 NOTE — Progress Notes (Signed)
CSW confirmed patient is resident at [facility]. Patient plans to return when medically stable.   Catha Gosselin, LCSWA  [phone] .07/03/2012 1120am

## 2013-02-18 ENCOUNTER — Ambulatory Visit (HOSPITAL_COMMUNITY): Payer: Medicare Other

## 2013-02-18 ENCOUNTER — Emergency Department (HOSPITAL_COMMUNITY)
Admission: EM | Admit: 2013-02-18 | Discharge: 2013-02-18 | Disposition: A | Discharge status: Skilled Nursing Facility | Payer: Medicare Other | Attending: Emergency Medicine | Admitting: Emergency Medicine

## 2013-02-18 ENCOUNTER — Encounter (HOSPITAL_COMMUNITY): Payer: Self-pay | Admitting: Emergency Medicine

## 2013-02-18 DIAGNOSIS — R296 Repeated falls: Secondary | ICD-10-CM | POA: Diagnosis not present

## 2013-02-18 DIAGNOSIS — G309 Alzheimer's disease, unspecified: Secondary | ICD-10-CM | POA: Insufficient documentation

## 2013-02-18 DIAGNOSIS — Y9210 Unspecified residential institution as the place of occurrence of the external cause: Secondary | ICD-10-CM | POA: Insufficient documentation

## 2013-02-18 DIAGNOSIS — W19XXXA Unspecified fall, initial encounter: Secondary | ICD-10-CM

## 2013-02-18 DIAGNOSIS — Z79899 Other long term (current) drug therapy: Secondary | ICD-10-CM | POA: Diagnosis not present

## 2013-02-18 DIAGNOSIS — Y939 Activity, unspecified: Secondary | ICD-10-CM | POA: Insufficient documentation

## 2013-02-18 DIAGNOSIS — F0280 Dementia in other diseases classified elsewhere without behavioral disturbance: Secondary | ICD-10-CM | POA: Diagnosis not present

## 2013-02-18 DIAGNOSIS — Z043 Encounter for examination and observation following other accident: Secondary | ICD-10-CM | POA: Diagnosis present

## 2013-02-18 LAB — COMPREHENSIVE METABOLIC PANEL
ALK PHOS: 63 U/L (ref 39–117)
ALT: 12 U/L (ref 0–35)
AST: 26 U/L (ref 0–37)
Albumin: 3.7 g/dL (ref 3.5–5.2)
BILIRUBIN TOTAL: 0.2 mg/dL — AB (ref 0.3–1.2)
BUN: 18 mg/dL (ref 6–23)
CO2: 27 meq/L (ref 19–32)
Calcium: 9.4 mg/dL (ref 8.4–10.5)
Chloride: 102 mEq/L (ref 96–112)
Creatinine, Ser: 0.74 mg/dL (ref 0.50–1.10)
GFR, EST AFRICAN AMERICAN: 83 mL/min — AB (ref >90)
GFR, EST NON AFRICAN AMERICAN: 72 mL/min — AB (ref >90)
GLUCOSE: 79 mg/dL (ref 70–99)
POTASSIUM: 4.3 meq/L (ref 3.7–5.3)
SODIUM: 140 meq/L (ref 137–147)
Total Protein: 7.3 g/dL (ref 6.0–8.3)

## 2013-02-18 LAB — CBC WITH DIFFERENTIAL/PLATELET
Basophils Absolute: 0 10*3/uL (ref 0.0–0.1)
Basophils Relative: 0 % (ref 0–1)
Eosinophils Absolute: 0 10*3/uL (ref 0.0–0.7)
Eosinophils Relative: 1 % (ref 0–5)
HCT: 36.3 % (ref 36.0–46.0)
HEMOGLOBIN: 11.4 g/dL — AB (ref 12.0–15.0)
LYMPHS ABS: 1.2 10*3/uL (ref 0.7–4.0)
LYMPHS PCT: 31 % (ref 12–46)
MCH: 26.5 pg (ref 26.0–34.0)
MCHC: 31.4 g/dL (ref 30.0–36.0)
MCV: 84.2 fL (ref 78.0–100.0)
MONOS PCT: 7 % (ref 3–12)
Monocytes Absolute: 0.3 10*3/uL (ref 0.1–1.0)
NEUTROS PCT: 61 % (ref 43–77)
Neutro Abs: 2.2 10*3/uL (ref 1.7–7.7)
PLATELETS: 184 10*3/uL (ref 150–400)
RBC: 4.31 MIL/uL (ref 3.87–5.11)
RDW: 13.9 % (ref 11.5–15.5)
WBC: 3.7 10*3/uL — AB (ref 4.0–10.5)

## 2013-02-18 LAB — URINALYSIS, ROUTINE W REFLEX MICROSCOPIC
BILIRUBIN URINE: NEGATIVE
Glucose, UA: NEGATIVE mg/dL
Hgb urine dipstick: NEGATIVE
KETONES UR: NEGATIVE mg/dL
Leukocytes, UA: NEGATIVE
NITRITE: NEGATIVE
PROTEIN: NEGATIVE mg/dL
Specific Gravity, Urine: 1.024 (ref 1.005–1.030)
UROBILINOGEN UA: 0.2 mg/dL (ref 0.0–1.0)
pH: 6 (ref 5.0–8.0)

## 2013-02-18 NOTE — ED Notes (Signed)
In addition to below note, pt denies hitting head, states her "body wouldn't hold up" when she went to walk.

## 2013-02-18 NOTE — ED Provider Notes (Signed)
CSN: [id number]     Arrival date & time 02/18/13  1305 History   First MD Initiated Contact with Patient 02/18/13 1330     Chief Complaint  Patient presents with  . Fall   (Consider location/radiation/quality/duration/timing/severity/associated sxs/prior Treatment) HPI 80 y/o f with dementia and hx of repeated ED visits for fall sent by her SNF for fall.  Patient had 2 witnessed fall by stopped approximately 15 minutes apart.  No head or neck trauma reported should states "my body just wouldn't hold me up."  Level 5 caveat.  Past Medical History  Diagnosis Date  . Dementia in Alzheimer's disease    No past surgical history on file. No family history on file. History  Substance Use Topics  . Smoking status: Unknown If Ever Smoked  . Smokeless tobacco: Not on file  . Alcohol Use: No   OB History   Grav Para Term Preterm Abortions TAB SAB Ect Mult Living                 Review of Systems Ten systems reviewed and are negative for acute change, except as noted in the HPI.  Allergies  Review of patient's allergies indicates no known allergies.  Home Medications   Current Outpatient Rx  Name  Route  Sig  Dispense  Refill  . acetaminophen (TYLENOL) 325 MG tablet   Oral   Take 650 mg by mouth every 6 (six) hours.          . docusate sodium (COLACE) 100 MG capsule   Oral   Take 100 mg by mouth 2 (two) times daily.         Marland Kitchen donepezil (ARICEPT) 10 MG tablet   Oral   Take 10 mg by mouth at bedtime.         Marland Kitchen loratadine (CLARITIN) 10 MG tablet   Oral   Take 10 mg by mouth daily.         . Memantine HCl ER (NAMENDA XR) 28 MG CP24   Oral   Take 1 capsule by mouth daily.         . mirtazapine (REMERON) 15 MG tablet   Oral   Take 15 mg by mouth at bedtime.         . Nutritional Supplements (NUTRITIONAL SHAKE PO)   Oral   Take 1 Bottle by mouth 2 (two) times daily. Mighty Shakes.         . Olopatadine HCl (PATADAY) 0.2 % SOLN   Both Eyes   Place 1 drop  into both eyes at bedtime.         . polyethylene glycol (MIRALAX / GLYCOLAX) packet   Oral   Take 17 g by mouth daily.         . Vitamin D, Ergocalciferol, (DRISDOL) 50000 UNITS CAPS   Oral   Take 50,000 Units by mouth every 7 (seven) days. Taken on Saturdays.          BP 150/70  Pulse 89  Temp(Src) 98.5 F (36.9 C)  Resp 13  SpO2 95% Physical Exam Physical Exam  Nursing note and vitals reviewed. Constitutional: She is oriented to person, place, and time. She appears well-developed and well-nourished. No distress.  HENT:  Head: Normocephalic and atraumatic. No signs of trauma Eyes: Conjunctivae normal and EOM are normal. Pupils are equal, round, and reactive to light. No scleral icterus.  Neck: Normal range of motion.  Cardiovascular: Normal rate, regular rhythm and normal heart sounds.  Exam  reveals no gallop and no friction rub.   No murmur heard. Pulmonary/Chest: Effort normal and breath sounds normal. No respiratory distress.  Abdominal: Soft. Bowel sounds are normal. She exhibits no distension and no mass. There is no tenderness. There is no guarding.  Neurological: She is alert and oriented to person, place, and time.  Skin: Skin is warm and dry. She is not diaphoretic.    ED Course  Procedures (including critical care time) Labs Review Labs Reviewed  CBC WITH DIFFERENTIAL - Abnormal; Notable for the following:    WBC 3.7 (*)    Hemoglobin 11.4 (*)    All other components within normal limits  COMPREHENSIVE METABOLIC PANEL - Abnormal; Notable for the following:    Total Bilirubin 0.2 (*)    GFR calc non Af Amer 72 (*)    GFR calc Af Amer 83 (*)    All other components within normal limits  URINALYSIS, ROUTINE W REFLEX MICROSCOPIC   Imaging Review Ct Head Wo Contrast  02/18/2013   CLINICAL DATA:  Status post fall x2.  EXAM: CT HEAD WITHOUT CONTRAST  TECHNIQUE: Contiguous axial images were obtained from the base of the skull through the vertex without  intravenous contrast.  COMPARISON:  Head CT scan 06/2009/2014.  FINDINGS: There is chronic microvascular ischemic change. No evidence of acute intracranial abnormality including infarct, hemorrhage, mass lesion, mass effect, midline shift or abnormal extra-axial fluid collection is identified. There is no hydrocephalus or pneumocephalus. The calvarium is intact.  IMPRESSION: No acute finding.  Chronic microvascular ischemic change.   Electronically Signed   By: Drusilla Kannerhomas  Dalessio M.D.   On: 02/18/2013 15:01   Ct Cervical Spine Wo Contrast  02/18/2013   CLINICAL DATA:  Status post fall x2.  EXAM: CT CERVICAL SPINE WITHOUT CONTRAST  TECHNIQUE: Multidetector CT imaging of the cervical spine was performed without intravenous contrast. Multiplanar CT image reconstructions were also generated.  COMPARISON:  Cervical spine CT scan 07/31/2011.  FINDINGS: No fracture or malalignment is identified. Degenerative disc disease appears worst at C5-6. Kyphosis is noted. Lung apices are clear.  IMPRESSION: No acute finding.  Stable compared to prior exam.   Electronically Signed   By: Drusilla Kannerhomas  Dalessio M.D.   On: 02/18/2013 15:03    EKG Interpretation   None       MDM  No diagnosis found. CT negative. Mild leukopenia.  Awaiting ua  Patient UA still pending at shift end.  Anticipate discharge. Patient care hand off to PA [place].  Arthor Captainbigail Whitlee Sluder, PA-C 02/18/13 1850

## 2013-02-18 NOTE — ED Notes (Signed)
Per EMS: Pt had 2 witnessed falls in 15 mins at facility.  No LOC.  No thinners.  No complaint.  No deformities.  Underlying dementia.

## 2013-02-18 NOTE — Discharge Instructions (Signed)
Please call your doctor for a followup appointment within 24-48 hours. When you talk to your doctor please let them know that you were seen in the emergency department and have them acquire all of your records so that they can discuss the findings with you and formulate a treatment plan to fully care for your new and ongoing problems. Please call and set-up an appointment with your primary care provider to be re-assessed regarding the recent fall Please rest and stay hydrated Please avoid any physical or strenuous activity Please continue to monitor symptoms closely and if symptoms are to worsen or change (fever greater than 101, chills, chest pain, shortness of breath, difficulty breathing, changes to behavior, aggression, nausea, vomiting, fall, head injury) please report back to the ED immediately  Fall Prevention and Home Safety Falls cause injuries and can affect all age groups. It is possible to use preventive measures to significantly decrease the likelihood of falls. There are many simple measures which can make your home safer and prevent falls. OUTDOORS  Repair cracks and edges of walkways and driveways.  Remove high doorway thresholds.  Trim shrubbery on the main path into your home.  Have good outside lighting.  Clear walkways of tools, rocks, debris, and clutter.  Check that handrails are not broken and are securely fastened. Both sides of steps should have handrails.  Have leaves, snow, and ice cleared regularly.  Use sand or salt on walkways during winter months.  In the garage, clean up grease or oil spills. BATHROOM  Install night lights.  Install grab bars by the toilet and in the tub and shower.  Use non-skid mats or decals in the tub or shower.  Place a plastic non-slip stool in the shower to sit on, if needed.  Keep floors dry and clean up all water on the floor immediately.  Remove soap buildup in the tub or shower on a regular basis.  Secure bath mats  with non-slip, double-sided rug tape.  Remove throw rugs and tripping hazards from the floors. BEDROOMS  Install night lights.  Make sure a bedside light is easy to reach.  Do not use oversized bedding.  Keep a telephone by your bedside.  Have a firm chair with side arms to use for getting dressed.  Remove throw rugs and tripping hazards from the floor. KITCHEN  Keep handles on pots and pans turned toward the center of the stove. Use back burners when possible.  Clean up spills quickly and allow time for drying.  Avoid walking on wet floors.  Avoid hot utensils and knives.  Position shelves so they are not too high or low.  Place commonly used objects within easy reach.  If necessary, use a sturdy step stool with a grab bar when reaching.  Keep electrical cables out of the way.  Do not use floor polish or wax that makes floors slippery. If you must use wax, use non-skid floor wax.  Remove throw rugs and tripping hazards from the floor. STAIRWAYS  Never leave objects on stairs.  Place handrails on both sides of stairways and use them. Fix any loose handrails. Make sure handrails on both sides of the stairways are as long as the stairs.  Check carpeting to make sure it is firmly attached along stairs. Make repairs to worn or loose carpet promptly.  Avoid placing throw rugs at the top or bottom of stairways, or properly secure the rug with carpet tape to prevent slippage. Get rid of throw rugs, if  possible.  Have an electrician put in a light switch at the top and bottom of the stairs. OTHER FALL PREVENTION TIPS  Wear low-heel or rubber-soled shoes that are supportive and fit well. Wear closed toe shoes.  When using a stepladder, make sure it is fully opened and both spreaders are firmly locked. Do not climb a closed stepladder.  Add color or contrast paint or tape to grab bars and handrails in your home. Place contrasting color strips on first and last  steps.  Learn and use mobility aids as needed. Install an electrical emergency response system.  Turn on lights to avoid dark areas. Replace light bulbs that burn out immediately. Get light switches that glow.  Arrange furniture to create clear pathways. Keep furniture in the same place.  Firmly attach carpet with non-skid or double-sided tape.  Eliminate uneven floor surfaces.  Select a carpet pattern that does not visually hide the edge of steps.  Be aware of all pets. OTHER HOME SAFETY TIPS  Set the water temperature for 120 F (48.8 C).  Keep emergency numbers on or near the telephone.  Keep smoke detectors on every level of the home and near sleeping areas. Document Released: 12/30/2001 Document Revised: 07/11/2011 Document Reviewed: 03/31/2011 [facility] Patient Information 2014 [place], [place].   Emergency Department Resource Guide 1) Find a Doctor and Pay Out of Pocket Although you won't have to find out who is covered by your insurance plan, it is a good idea to ask around and get recommendations. You will then need to call the office and see if the doctor you have chosen will accept you as a new patient and what types of options they offer for patients who are self-pay. Some doctors offer discounts or will set up payment plans for their patients who do not have insurance, but you will need to ask so you aren't surprised when you get to your appointment.  2) Contact Your Local Health Department Not all health departments have doctors that can see patients for sick visits, but many do, so it is worth a call to see if yours does. If you don't know where your local health department is, you can check in your phone book. The CDC also has a tool to help you locate your state's health department, and many state websites also have listings of all of their local health departments.  3) Find a Walk-in Clinic If your illness is not likely to be very severe or complicated, you may want  to try a walk in clinic. These are popping up all over the country in pharmacies, drugstores, and shopping centers. They're usually staffed by nurse practitioners or physician assistants that have been trained to treat common illnesses and complaints. They're usually fairly quick and inexpensive. However, if you have serious medical issues or chronic medical problems, these are probably not your best option.  No Primary Care Doctor: - Call Health Connect at  [phone] - they can help you locate a primary care doctor that  accepts your insurance, provides certain services, etc. - Physician Referral Service- [phone]  Chronic Pain Problems: Organization         Address  Phone   Notes  [facility]  [phone] Patients need to be referred by their primary care doctor.   Medication Assistance: Organization         Address  Phone   Notes  [facility] Medication Assistance Program [address] [place] [place], [place]  [phone]) [phone] --Must be a resident of [place] -- Must have NO insurance coverage whatsoever (no Medicaid/ Medicare, etc.) -- The pt. MUST have a primary care doctor that directs their care regularly and follows them in the community   MedAssist  [phone]   [employer]  [phone]) [phone]    Agencies that provide inexpensive medical care: Organization         Address  Phone   Notes  [facility]  [phone](336) [phone]   [facility]    [phone](336) [phone]   [facility] [address] [place], [place] [address] [phone]) [phone]   [facility] [place] [address], [place] [phone]   Planned Parenthood    [phone]   [facility]    [phone]   [facility]  [address], [place] Phone:  [phone](336) [phone], Fax:  [phone] Hours of Operation:  9 am - 6 pm, M-F.  Also accepts Medicaid/Medicare and self-pay.   [facility]  [address], [place] Phone: [phone]) [phone], Fax: [phone]. Hours of Operation:  8:30 am - 5:30 pm, M-F.  Also accepts Medicaid and self-pay.  [facility] [place] [address], [place] Phone: [phone]) [phone]   [facility] [address], [place], [place] [phone], Ext. [phone] Mondays & Thursdays: 7-9 AM.  First 15 patients are seen on a first come, first serve basis.    Medicaid-accepting [facility] Providers:  Organization         Address  Phone   Notes  [facility] [address], [place] [phone] Also accepts self-pay patients.  [facility] [address] [place], [place]  [phone]   [facility] [address], [place] [phone](336) [phone]   [facility] [address] [place] [address], [place] [phone]) [phone]   Renaye RakersVeita Bland [address], [place]   [phone] Only accepts [place] Access [place] patients after they have their name applied to their card.   Self-Pay (no insurance) in [facility] [place] [facility]:  Organization         Address  Phone   Notes  Sickle Cell Patients, [facility] [address], [place] [phone]   [facility] [address], [place] [phone]   [facility] [place]  [address],  [phone]   [facility]/Dr. Osei-Bonsu  [address] [place] [address], [place] or [phone], [place] [phone]) [phone] Phone number for both [place] and [place] locations is the same.  [facility] [address], [place] [phone]   [facility]  [place] [address] [place] [address], [place] or [address] [phone]   [facility] [address], [place] [phone]) [phone], phone; [phone])  [phone], fax Sees patients 1st and 3rd Saturday of every month.  Must not qualify for public or private insurance (i.e. Medicaid, Medicare, [place] Health Choice, Veterans' Benefits)  Household income should be no more than 200% of the poverty level The clinic cannot treat you if you are pregnant or think you are pregnant  Sexually transmitted diseases are not treated at the clinic.    Dental Care: Organization  Address  Phone  Notes  [facility] Department of [facility] [address] [place], [place] [phone] Accepts children up to age 30 who are enrolled in [place] or [place] Health Choice; pregnant women with a Medicaid card; and children who have applied for Medicaid or [place] Health Choice, but were declined, whose parents can pay a reduced fee at time of service.  [facility] Department of [facility]  [address], [place] [phone] Accepts children up to age 36 who are enrolled in [place] or [place] Health Choice; pregnant women with a Medicaid card; and children who have applied for Medicaid or [place] Health Choice, but were declined, whose parents can pay a reduced fee at time of service.  [facility]  [address] [place], [place] [phone] Patients are seen by appointment only. Walk-ins are not accepted. [facility] will see patients 60 years of age and older. Monday - Tuesday (8am-5pm) Most Wednesdays (8:30-5pm) $30 per visit, cash only  [facility] Adult Dental Access PROGRAM  [address], [facility] [phone] Patients are seen by appointment only. Walk-ins are not accepted. [facility] will see patients 33 years of age and older. One Wednesday Evening (Monthly: Volunteer Based).  $30 per visit, cash only  [employer]  [phone] for adults; Children under age 34, call Graduate Pediatric Dentistry at [phone]. Children aged 44-14, please call [phone] to request a pediatric application.  Dental services are provided in all areas of dental care including fillings, crowns and bridges, complete and partial dentures, implants, gum treatment, root canals, and extractions. Preventive care is also provided. Treatment is provided to both adults and children. Patients are selected via a lottery and there is often a waiting list.   [facility] [address], [place]  [phone] [email or web address]   [facility] [address] [place], [place] [phone], Ext. [phone] Second and Fourth Thursday of each month, opens at 6:30 AM; Clinic ends at 9 AM.  Patients are seen on a first-come first-served basis, and a limited number are seen during each clinic.   [facility]  [address] [place], [place] [phone]   Eligibility Requirements You must have lived in [place], [place], or [place] counties for at least the last three months.   You cannot be eligible for state or federal sponsored [place], including [employer], [place], or [employer].   You generally cannot be eligible for healthcare insurance through your employer.    How to apply: Eligibility screenings are held every Tuesday and Wednesday afternoon from 1:00 pm until 4:00 pm. You do not need an appointment for the interview!  [facility] [address], [place], [place] [phone]   [place] [facility] Health Department  [phone]   [facility] Health Department  [phone]   [facility] Health Department  [phone]    Behavioral Health Resources in the Community: Intensive Outpatient Programs Organization         Address  Phone  Notes  [facility] [address], [place], [place] [phone]   [facility] Outpatient [address] [place], [place] [phone]   [facility] [address] [place], [place]  [phone]    [facility] Mental Health [address]  [place], [place] [phone] or [phone]   Substance Abuse Resources  Organization         Address  Phone  Notes  [facility] Drug Services  [phone]   [facility]  [phone]   [facility] [place]  [phone]   Floydene Flock  [phone]   Residential & Outpatient Substance Abuse Program  [phone]   Psychological Services Organization         Address  Phone  Notes  [facility]al Health  [phone]   [facility] Services  [phone]   [facility] Mental Health [address], [place] [phone] or [phone]    Mobile Crisis Teams Organization         Address  Phone  Notes  [facility], Mobile Crisis Care Unit  [phone]   [facility]  [address] [place], [place] [phone]   Doristine Locks [address] [place] [phone]    Self-Help/Support Groups Organization         Address  Phone             Notes  [facility] [place] - variety of support groups  336- [phone] Call for more information  Narcotics Anonymous (NA), [facility] [address], [employer] [place]  2 meetings at this location   Statistician         Address  Phone  Notes  [facility] [address],    [place]  [phone]   [facility]  [address] [place] [address], [place], [place] [phone]   [facility] [address] [place], [place] [phone] Admissions: 8am-3pm M-F  [facility] [address]    [place], [place] [phone]   [facility] [address] [place], [place], [place] [phone]   [facility] [address]  [place], [place] [phone]   [facility] - Intensive Outpatient [address], [place], [place] [phone]   [facility] ([facility] [address]  [place], [place] [phone] or [phone]   [facility] ([facility]) [address], [place], [place] [phone] Accepts Medicaid  [facility] [address]  [place] [phone] Substance Abuse/Addiction Treatment   [facility] Organization         Address  Phone  Notes  [facility]  [phone]   Angie Fava, PhD [address] [place], [place]   [phone] or 9176181431   [facility]   [address] [place], [place] [phone]   [facility] [address] [place], [place] [phone] Insurance/Medicaid/sponsorship through [facility] and Families [address]                                    [place], [place] [phone] Therapy/tele-psych/case  [facility] [address], [place] [phone]    Dr. Lolly Mustache  [phone]   [facility]  [employer] [facility] Dept. 1) [address] [place] 2) [address] [place] 3)  [address], [place] [phone]  [phone]   [facility] Child Abuse Hotline [phone] or 9300967425 (After Hours)

## 2013-02-18 NOTE — ED Provider Notes (Signed)
Discussed case with Mandy Captain, PA-C. Transfer of care from [place], PA-C at change in shift.  Plan is to wait for urine - if urine negative patient able to be discharged.   Mandy Welch is a 80 y/o F with PMHx of dementia with recurrent falls - presenting to the ED with a fall today.   Results for orders placed during the hospital encounter of 02/18/13  CBC WITH DIFFERENTIAL      Result Value Range   WBC 3.7 (*) 4.0 - 10.5 K/uL   RBC 4.31  3.87 - 5.11 MIL/uL   Hemoglobin 11.4 (*) 12.0 - 15.0 g/dL   HCT 16.1  09.6 - 04.5 %   MCV 84.2  78.0 - 100.0 fL   MCH 26.5  26.0 - 34.0 pg   MCHC 31.4  30.0 - 36.0 g/dL   RDW 40.9  81.1 - 91.4 %   Platelets 184  150 - 400 K/uL   Neutrophils Relative % 61  43 - 77 %   Neutro Abs 2.2  1.7 - 7.7 K/uL   Lymphocytes Relative 31  12 - 46 %   Lymphs Abs 1.2  0.7 - 4.0 K/uL   Monocytes Relative 7  3 - 12 %   Monocytes Absolute 0.3  0.1 - 1.0 K/uL   Eosinophils Relative 1  0 - 5 %   Eosinophils Absolute 0.0  0.0 - 0.7 K/uL   Basophils Relative 0  0 - 1 %   Basophils Absolute 0.0  0.0 - 0.1 K/uL  COMPREHENSIVE METABOLIC PANEL      Result Value Range   Sodium 140  137 - 147 mEq/L   Potassium 4.3  3.7 - 5.3 mEq/L   Chloride 102  96 - 112 mEq/L   CO2 27  19 - 32 mEq/L   Glucose, Bld 79  70 - 99 mg/dL   BUN 18  6 - 23 mg/dL   Creatinine, Ser 7.82  0.50 - 1.10 mg/dL   Calcium 9.4  8.4 - 95.6 mg/dL   Total Protein 7.3  6.0 - 8.3 g/dL   Albumin 3.7  3.5 - 5.2 g/dL   AST 26  0 - 37 U/L   ALT 12  0 - 35 U/L   Alkaline Phosphatase 63  39 - 117 U/L   Total Bilirubin 0.2 (*) 0.3 - 1.2 mg/dL   GFR calc non Af Amer 72 (*) >90 mL/min   GFR calc Af Amer 83 (*) >90 mL/min  URINALYSIS, ROUTINE W REFLEX MICROSCOPIC      Result Value Range   Color, Urine YELLOW  YELLOW   APPearance CLEAR  CLEAR   Specific Gravity, Urine 1.024  1.005 - 1.030   pH 6.0  5.0 - 8.0   Glucose, UA NEGATIVE  NEGATIVE mg/dL   Hgb urine dipstick NEGATIVE  NEGATIVE   Bilirubin Urine NEGATIVE  NEGATIVE   Ketones, ur NEGATIVE  NEGATIVE mg/dL   Protein, ur NEGATIVE  NEGATIVE mg/dL   Urobilinogen, UA 0.2  0.0 - 1.0 mg/dL   Nitrite NEGATIVE  NEGATIVE   Leukocytes, UA NEGATIVE  NEGATIVE    Ct Head Wo Contrast  02/18/2013   CLINICAL DATA:  Status post fall x2.  EXAM: CT HEAD WITHOUT CONTRAST  TECHNIQUE: Contiguous axial images were obtained from the base of the skull through the vertex without intravenous contrast.  COMPARISON:  Head CT scan 06/2009/2014.  FINDINGS: There is chronic microvascular ischemic change. No evidence of acute intracranial abnormality including  infarct, hemorrhage, mass lesion, mass effect, midline shift or abnormal extra-axial fluid collection is identified. There is no hydrocephalus or pneumocephalus. The calvarium is intact.  IMPRESSION: No acute finding.  Chronic microvascular ischemic change.   Electronically Signed   By: Drusilla Kannerhomas  Dalessio M.D.   On: 02/18/2013 15:01   Ct Cervical Spine Wo Contrast  02/18/2013   CLINICAL DATA:  Status post fall x2.  EXAM: CT CERVICAL SPINE WITHOUT CONTRAST  TECHNIQUE: Multidetector CT imaging of the cervical spine was performed without intravenous contrast. Multiplanar CT image reconstructions were also generated.  COMPARISON:  Cervical spine CT scan 07/31/2011.  FINDINGS: No fracture or malalignment is identified. Degenerative disc disease appears worst at C5-6. Kyphosis is noted. Lung apices are clear.  IMPRESSION: No acute finding.  Stable compared to prior exam.   Electronically Signed   By: Drusilla Kannerhomas  Dalessio M.D.   On: 02/18/2013 15:03   Urinalysis negative for infection-negative nitrites or leukocyte ptosis noted, negative pyuria. CBC negative findings. CMP negative findings. CT head negative acute intracranial abnormalities, chronic microvascular ischemic changes noted. CT cervical spine negative for acute abnormalities.  6:35 PM This patient was re-assessed by the provider - patient doing well. Patient  stable afebrile. Discharged patient back to nursing home.   Filed Vitals:   02/18/13 1306 02/18/13 1313 02/18/13 1722  BP:  150/70 183/90  Pulse:  89 84  Temp:  98.5 F (36.9 C) 98.8 F (37.1 C)  TempSrc:   Oral  Resp:  13 16  SpO2: 98% 95% 94%   Diagnoses that have been ruled out:  None  Diagnoses that are still under consideration:  None  Final diagnoses:  Mandy MaterFall      Mandy Mccaughey, PA-C 02/19/13 [phone]

## 2013-02-18 NOTE — ED Notes (Signed)
Bed: WA15 Expected date:  Expected time:  Means of arrival:  Comments: fall 

## 2013-02-19 NOTE — ED Provider Notes (Signed)
Medical screening examination/treatment/procedure(s) were performed by non-physician practitioner and as supervising physician I was immediately available for consultation/collaboration.  EKG Interpretation   None         Audree CamelScott T Cane Dubray, MD 02/19/13 1027

## 2013-02-21 NOTE — ED Provider Notes (Signed)
Medical screening examination/treatment/procedure(s) were conducted as a shared visit with non-physician practitioner(s) and myself.  I personally evaluated the patient during the encounter.  EKG Interpretation   None      Well appearing. Awaiting UA.  Images without abnormality.    Lyanne CoKevin M Samantha Ragen, MD 02/21/13 [phone]

## 2013-05-30 ENCOUNTER — Emergency Department (HOSPITAL_COMMUNITY)
Admission: EM | Admit: 2013-05-30 | Discharge: 2013-05-31 | Disposition: A | Discharge status: Home / Self Care | Payer: Medicare Other | Attending: Emergency Medicine | Admitting: Emergency Medicine

## 2013-05-30 ENCOUNTER — Emergency Department (HOSPITAL_COMMUNITY): Payer: Medicare Other

## 2013-05-30 ENCOUNTER — Encounter (HOSPITAL_COMMUNITY): Payer: Self-pay | Admitting: Emergency Medicine

## 2013-05-30 DIAGNOSIS — Y9389 Activity, other specified: Secondary | ICD-10-CM | POA: Diagnosis not present

## 2013-05-30 DIAGNOSIS — IMO0002 Reserved for concepts with insufficient information to code with codable children: Secondary | ICD-10-CM | POA: Insufficient documentation

## 2013-05-30 DIAGNOSIS — F0280 Dementia in other diseases classified elsewhere without behavioral disturbance: Secondary | ICD-10-CM | POA: Diagnosis not present

## 2013-05-30 DIAGNOSIS — M4000 Postural kyphosis, site unspecified: Secondary | ICD-10-CM | POA: Insufficient documentation

## 2013-05-30 DIAGNOSIS — Y9210 Unspecified residential institution as the place of occurrence of the external cause: Secondary | ICD-10-CM | POA: Diagnosis not present

## 2013-05-30 DIAGNOSIS — G309 Alzheimer's disease, unspecified: Secondary | ICD-10-CM | POA: Insufficient documentation

## 2013-05-30 DIAGNOSIS — Z79899 Other long term (current) drug therapy: Secondary | ICD-10-CM | POA: Insufficient documentation

## 2013-05-30 DIAGNOSIS — R296 Repeated falls: Secondary | ICD-10-CM | POA: Diagnosis not present

## 2013-05-30 LAB — URINALYSIS, ROUTINE W REFLEX MICROSCOPIC
BILIRUBIN URINE: NEGATIVE
Glucose, UA: NEGATIVE mg/dL
HGB URINE DIPSTICK: NEGATIVE
KETONES UR: NEGATIVE mg/dL
Leukocytes, UA: NEGATIVE
Nitrite: NEGATIVE
PROTEIN: NEGATIVE mg/dL
SPECIFIC GRAVITY, URINE: 1.028 (ref 1.005–1.030)
UROBILINOGEN UA: 0.2 mg/dL (ref 0.0–1.0)
pH: 5.5 (ref 5.0–8.0)

## 2013-05-30 NOTE — ED Provider Notes (Signed)
CSN: [id number]     Arrival date & time 05/30/13  1821 History   First MD Initiated Contact with Patient 05/30/13 1827     Chief Complaint  Patient presents with  . Fall     (Consider location/radiation/quality/duration/timing/severity/associated sxs/prior Treatment) HPI  80 year old female with history of dementia with history of recurrent falls who lives at a nursing facility was sent here for evaluation of a witnessed fall. History is limited as patient has severe dementia. Patient only complaint is low back pain. Patient is a DNR/DNI, was sent here for evaluation for hospice care.  Past Medical History  Diagnosis Date  . Dementia in Alzheimer's disease    History reviewed. No pertinent past surgical history. No family history on file. History  Substance Use Topics  . Smoking status: Unknown If Ever Smoked  . Smokeless tobacco: Not on file  . Alcohol Use: No   OB History   Grav Para Term Preterm Abortions TAB SAB Ect Mult Living                 Review of Systems  Unable to perform ROS: Dementia      Allergies  Review of patient's allergies indicates no known allergies.  Home Medications   Prior to Admission medications   Medication Sig Start Date End Date Taking? Authorizing Provider  acetaminophen (TYLENOL) 325 MG tablet Take 650 mg by mouth every 6 (six) hours.     Historical Provider, MD  docusate sodium (COLACE) 100 MG capsule Take 100 mg by mouth 2 (two) times daily.    Historical Provider, MD  donepezil (ARICEPT) 10 MG tablet Take 10 mg by mouth at bedtime.    Historical Provider, MD  loratadine (CLARITIN) 10 MG tablet Take 10 mg by mouth daily.    Historical Provider, MD  Memantine HCl ER (NAMENDA XR) 28 MG CP24 Take 1 capsule by mouth daily.    Historical Provider, MD  mirtazapine (REMERON) 15 MG tablet Take 15 mg by mouth at bedtime.    Historical Provider, MD  Nutritional Supplements (NUTRITIONAL SHAKE PO) Take 1 Bottle by mouth 2 (two) times daily.  Mighty Shakes.    Historical Provider, MD  Olopatadine HCl (PATADAY) 0.2 % SOLN Place 1 drop into both eyes at bedtime.    Historical Provider, MD  polyethylene glycol (MIRALAX / GLYCOLAX) packet Take 17 g by mouth daily.    Historical Provider, MD  Vitamin D, Ergocalciferol, (DRISDOL) 50000 UNITS CAPS Take 50,000 Units by mouth every 7 (seven) days. Taken on Saturdays.    Historical Provider, MD   BP 130/67  Pulse 89  Temp(Src) 98 F (36.7 C) (Oral)  Resp 16  Wt 105 lb (47.628 kg)  SpO2 99% Physical Exam  Nursing note and vitals reviewed. Constitutional:  Small and cachectic appearing African American female appears in no acute distress  HENT:  Head: Normocephalic and atraumatic.  No hemotympanum, no septal hematoma, no malocclusion, no midface tenderness.  Eyes: Conjunctivae and EOM are normal. Pupils are equal, round, and reactive to light.  Neck: Normal range of motion. Neck supple.  Cardiovascular: Normal rate and regular rhythm.   Pulmonary/Chest: Effort normal and breath sounds normal.  Abdominal: Soft.  Musculoskeletal: She exhibits tenderness (Severe kyphosis with mild tenderness to lumbar spine without crepitus or step-off noted.). She exhibits no edema.  Neurological: She is alert.  Skin: No rash noted.  Psychiatric: She has a normal mood and affect.    ED Course  Procedures (including critical care time)  Pt here with witnessed fall, no document of LOC.  Pt currently in NAD, mild tenderness to lumbar spine but otherwise no evidence of injury.  Will check UA, and obtain Lspine xray.    7:45 PM UA and Lspine result are unremarkable.  Pt stable for discharge.  Care discussed with Dr. Gwendolyn GrantWalden.  Labs Review Labs Reviewed  URINALYSIS, ROUTINE W REFLEX MICROSCOPIC    Imaging Review Dg Lumbar Spine Complete  05/30/2013   CLINICAL DATA:  Mandy Welch, no complaints  EXAM: LUMBAR SPINE - COMPLETE 4+ VIEW  COMPARISON:  DG LUMBAR SPINE COMPLETE dated 11/04/2010  FINDINGS: There is  normal anterior-posterior alignment. No fracture. Multilevel degenerative change involving the facet joints and to a lesser degree disc spaces. Calcification of the aorta.  IMPRESSION: No acute findings   Electronically Signed   By: Esperanza Heiraymond  Rubner M.D.   On: 05/30/2013 19:36     EKG Interpretation None      MDM   Final diagnoses:  Recurrent falls    BP 130/67  Pulse 89  Temp(Src) 98 F (36.7 C) (Oral)  Resp 16  Wt 105 lb (47.628 kg)  SpO2 99%  I have reviewed nursing notes and vital signs. I personally reviewed the imaging tests through PACS system  I reviewed available ER/hospitalization records thought the EMR     Fayrene HelperBowie Santosh Petter, [place] Jersey[place]-C 05/30/13 1948

## 2013-05-30 NOTE — Discharge Instructions (Signed)

## 2013-05-30 NOTE — ED Notes (Signed)
Pt resting quietly with NAD,  Pt is awaiting [facility]

## 2013-05-30 NOTE — ED Notes (Signed)
Bed: WA13 Expected date:  Expected time:  Means of arrival:  Comments: ems fall 

## 2013-05-30 NOTE — ED Notes (Signed)
Hospice nurse at bedside,  Stating pt hit her head when she fell and she would like a head CT,  Dr Gwendolyn GrantWalden called and he said he would be at bedside shortly to talk with hospice nurse.

## 2013-05-30 NOTE — ED Notes (Signed)
Pt alert, [facility], pt had a witnessed fall, denies c/o, sent for evaluation per [facility], resp even unlabored, skin pwd

## 2013-05-30 NOTE — ED Provider Notes (Signed)
Medical screening examination/treatment/procedure(s) were conducted as a shared visit with non-physician practitioner(s) and myself.  I personally evaluated the patient during the encounter.   EKG Interpretation None       29F here s/p fall . Unwitnessed, found in her room. DNR/DNI on hospice. Demented, as baseline. Denies pain. No signs of head trauma, extremity trauma. Patient stable for discharge. Care plan discussed with her hospice nurse at call at the bedside, who is agreeable. No need for Head CT with no signs of head trauma, no anticoagulants.  Dagmar HaitWilliam Ogle Hoeffner, MD 05/30/13 [phone]

## 2013-05-30 NOTE — ED Notes (Signed)
P 

## 2013-07-29 ENCOUNTER — Encounter (HOSPITAL_COMMUNITY): Payer: Self-pay | Admitting: Emergency Medicine

## 2013-07-29 ENCOUNTER — Emergency Department (HOSPITAL_COMMUNITY)
Admission: EM | Admit: 2013-07-29 | Discharge: 2013-07-29 | Disposition: A | Discharge status: Skilled Nursing Facility | Payer: Medicare Other | Attending: Emergency Medicine | Admitting: Emergency Medicine

## 2013-07-29 DIAGNOSIS — W19XXXA Unspecified fall, initial encounter: Secondary | ICD-10-CM

## 2013-07-29 DIAGNOSIS — Y9389 Activity, other specified: Secondary | ICD-10-CM | POA: Insufficient documentation

## 2013-07-29 DIAGNOSIS — Z79899 Other long term (current) drug therapy: Secondary | ICD-10-CM | POA: Diagnosis not present

## 2013-07-29 DIAGNOSIS — F0280 Dementia in other diseases classified elsewhere without behavioral disturbance: Secondary | ICD-10-CM | POA: Diagnosis not present

## 2013-07-29 DIAGNOSIS — Y9210 Unspecified residential institution as the place of occurrence of the external cause: Secondary | ICD-10-CM | POA: Insufficient documentation

## 2013-07-29 DIAGNOSIS — G309 Alzheimer's disease, unspecified: Secondary | ICD-10-CM | POA: Diagnosis not present

## 2013-07-29 DIAGNOSIS — S0993XA Unspecified injury of face, initial encounter: Secondary | ICD-10-CM | POA: Insufficient documentation

## 2013-07-29 DIAGNOSIS — R296 Repeated falls: Secondary | ICD-10-CM | POA: Insufficient documentation

## 2013-07-29 DIAGNOSIS — S199XXA Unspecified injury of neck, initial encounter: Principal | ICD-10-CM

## 2013-07-29 NOTE — ED Notes (Signed)
Pt states her neck does not hurt now. Pt denies pain with palpation on her neck as well.

## 2013-07-29 NOTE — ED Notes (Signed)
Report called to [facility]. Pt is stable for transport. [facility] has been called

## 2013-07-29 NOTE — ED Notes (Signed)
Pt  [facility]  , pt had an fall not seen by staff last night , this am pt is c/o neck pain ,

## 2013-07-29 NOTE — ED Provider Notes (Signed)
CSN: [id number]     Arrival date & time 07/29/13  1116 History   First MD Initiated Contact with Patient 07/29/13 1147     Chief Complaint  Patient presents with  . Fall     Level V caveat: Dementia  HPI His reported that the patient had a fall last night.  This was unwitnessed.  The patient was in her normal state health today although it is reported that she complained of some neck pain this morning and that she gets in to the ER for evaluation.  The patient reports no neck pain at this time.  She moves her neck in all directions well she is telling me this and states it does not hurt.  She denies weakness in arms or legs.  No headache.  No other complaints.   Past Medical History  Diagnosis Date  . Dementia in Alzheimer's disease    History reviewed. No pertinent past surgical history. No family history on file. History  Substance Use Topics  . Smoking status: Unknown If Ever Smoked  . Smokeless tobacco: Not on file  . Alcohol Use: No   OB History   Grav Para Term Preterm Abortions TAB SAB Ect Mult Living                 Review of Systems  Unable to perform ROS: Dementia      Allergies  Review of patient's allergies indicates no known allergies.  Home Medications   Prior to Admission medications   Medication Sig Start Date End Date Taking? Authorizing Provider  acetaminophen (TYLENOL) 325 MG tablet Take 650 mg by mouth every 6 (six) hours.    Yes Historical Provider, MD  docusate sodium (COLACE) 100 MG capsule Take 100 mg by mouth 2 (two) times daily.   Yes Historical Provider, MD  donepezil (ARICEPT) 10 MG tablet Take 10 mg by mouth at bedtime.   Yes Historical Provider, MD  memantine (NAMENDA) 10 MG tablet Take 10 mg by mouth 2 (two) times daily.   Yes Historical Provider, MD  mirtazapine (REMERON) 15 MG tablet Take 15 mg by mouth at bedtime.   Yes Historical Provider, MD  Nutritional Supplements (NUTRITIONAL SHAKE PO) Take 1 Bottle by mouth 2 (two) times daily.  Mighty Shakes.   Yes Historical Provider, MD  Olopatadine HCl (PATADAY) 0.2 % SOLN Place 1 drop into both eyes at bedtime.   Yes Historical Provider, MD  polyethylene glycol (MIRALAX / GLYCOLAX) packet Take 17 g by mouth daily.   Yes Historical Provider, MD  senna-docusate (SENOKOT-S) 8.6-50 MG per tablet Take 1 tablet by mouth 2 (two) times daily.   Yes Historical Provider, MD  Vitamin D, Ergocalciferol, (DRISDOL) 50000 UNITS CAPS Take 50,000 Units by mouth every 7 (seven) days. Taken on Saturdays.   Yes Historical Provider, MD   BP 150/80  Pulse 75  Temp(Src) 98.3 F (36.8 C) (Oral)  Resp 16  SpO2 100% Physical Exam  Nursing note and vitals reviewed. Constitutional: She appears well-developed and well-nourished. No distress.  HENT:  Head: Normocephalic and atraumatic.  Eyes: EOM are normal.  Neck: Normal range of motion.  No cervical tenderness or step-off  Cardiovascular: Normal rate, regular rhythm and normal heart sounds.   Pulmonary/Chest: Effort normal and breath sounds normal.  Abdominal: Soft. She exhibits no distension. There is no tenderness.  Musculoskeletal: Normal range of motion.  Normal range of motion of bilateral ankles, knees, hips.  Neurological: She is alert.  Oriented x2.  5  out of 5 strength in bilateral upper lower extremity major muscle groups  Skin: Skin is warm and dry.  Psychiatric: She has a normal mood and affect. Judgment normal.    ED Course  Procedures (including critical care time) Labs Review Labs Reviewed - No data to display  Imaging Review No results found.   EKG Interpretation None      MDM   Final diagnoses:  Fall, initial encounter   No pain or tenderness of her cervical spine my evaluation.  Patient is without complaint.  She moves her arms legs.  Baseline mental status.  No indication for acute imaging.  Discharge back to the nursing facility for ongoing evaluation.  Normal vital signs.     Lyanne CoKevin M Niemah Schwebke, MD 07/30/13  [phone]

## 2014-12-30 ENCOUNTER — Emergency Department (HOSPITAL_COMMUNITY)
Admission: EM | Admit: 2014-12-30 | Discharge: 2014-12-30 | Disposition: A | Discharge status: Home / Self Care | Payer: Medicare Other | Attending: Emergency Medicine | Admitting: Emergency Medicine

## 2014-12-30 ENCOUNTER — Emergency Department (HOSPITAL_COMMUNITY): Payer: Medicare Other

## 2014-12-30 ENCOUNTER — Encounter (HOSPITAL_COMMUNITY): Payer: Self-pay

## 2014-12-30 DIAGNOSIS — G309 Alzheimer's disease, unspecified: Secondary | ICD-10-CM | POA: Diagnosis not present

## 2014-12-30 DIAGNOSIS — Z79899 Other long term (current) drug therapy: Secondary | ICD-10-CM | POA: Insufficient documentation

## 2014-12-30 DIAGNOSIS — R52 Pain, unspecified: Secondary | ICD-10-CM | POA: Diagnosis not present

## 2014-12-30 DIAGNOSIS — Y998 Other external cause status: Secondary | ICD-10-CM | POA: Insufficient documentation

## 2014-12-30 DIAGNOSIS — F0280 Dementia in other diseases classified elsewhere without behavioral disturbance: Secondary | ICD-10-CM | POA: Insufficient documentation

## 2014-12-30 DIAGNOSIS — Y92121 Bathroom in nursing home as the place of occurrence of the external cause: Secondary | ICD-10-CM | POA: Insufficient documentation

## 2014-12-30 DIAGNOSIS — M40204 Unspecified kyphosis, thoracic region: Secondary | ICD-10-CM | POA: Diagnosis not present

## 2014-12-30 DIAGNOSIS — Y9389 Activity, other specified: Secondary | ICD-10-CM | POA: Insufficient documentation

## 2014-12-30 DIAGNOSIS — W1839XA Other fall on same level, initial encounter: Secondary | ICD-10-CM | POA: Insufficient documentation

## 2014-12-30 DIAGNOSIS — W19XXXA Unspecified fall, initial encounter: Secondary | ICD-10-CM

## 2014-12-30 DIAGNOSIS — T148 Other injury of unspecified body region: Secondary | ICD-10-CM | POA: Insufficient documentation

## 2014-12-30 DIAGNOSIS — T148XXA Other injury of unspecified body region, initial encounter: Secondary | ICD-10-CM

## 2014-12-30 DIAGNOSIS — Z043 Encounter for examination and observation following other accident: Secondary | ICD-10-CM | POA: Diagnosis present

## 2014-12-30 HISTORY — DX: Unspecified kyphosis, site unspecified: M40.209

## 2014-12-30 LAB — CBC WITH DIFFERENTIAL/PLATELET
BASOS ABS: 0 10*3/uL (ref 0.0–0.1)
BASOS PCT: 0 %
Eosinophils Absolute: 0.1 10*3/uL (ref 0.0–0.7)
Eosinophils Relative: 2 %
HCT: 34 % — ABNORMAL LOW (ref 36.0–46.0)
HEMOGLOBIN: 10.6 g/dL — AB (ref 12.0–15.0)
Lymphocytes Relative: 30 %
Lymphs Abs: 1.2 10*3/uL (ref 0.7–4.0)
MCH: 26.8 pg (ref 26.0–34.0)
MCHC: 31.2 g/dL (ref 30.0–36.0)
MCV: 86.1 fL (ref 78.0–100.0)
Monocytes Absolute: 0.3 10*3/uL (ref 0.1–1.0)
Monocytes Relative: 9 %
NEUTROS ABS: 2.4 10*3/uL (ref 1.7–7.7)
Neutrophils Relative %: 59 %
Platelets: 156 10*3/uL (ref 150–400)
RBC: 3.95 MIL/uL (ref 3.87–5.11)
RDW: 13.9 % (ref 11.5–15.5)
WBC: 4 10*3/uL (ref 4.0–10.5)

## 2014-12-30 LAB — URINALYSIS, ROUTINE W REFLEX MICROSCOPIC
Bilirubin Urine: NEGATIVE
Glucose, UA: NEGATIVE mg/dL
Hgb urine dipstick: NEGATIVE
Ketones, ur: NEGATIVE mg/dL
NITRITE: NEGATIVE
Protein, ur: NEGATIVE mg/dL
SPECIFIC GRAVITY, URINE: 1.024 (ref 1.005–1.030)
pH: 6.5 (ref 5.0–8.0)

## 2014-12-30 LAB — BASIC METABOLIC PANEL
Anion gap: 6 (ref 5–15)
BUN: 20 mg/dL (ref 6–20)
CALCIUM: 9.2 mg/dL (ref 8.9–10.3)
CO2: 31 mmol/L (ref 22–32)
Chloride: 106 mmol/L (ref 101–111)
Creatinine, Ser: 0.67 mg/dL (ref 0.44–1.00)
Glucose, Bld: 87 mg/dL (ref 65–99)
Potassium: 4.3 mmol/L (ref 3.5–5.1)
Sodium: 143 mmol/L (ref 135–145)

## 2014-12-30 LAB — URINE MICROSCOPIC-ADD ON

## 2014-12-30 NOTE — ED Notes (Signed)
Patient transported to CT 

## 2014-12-30 NOTE — ED Notes (Signed)
Per [facility]  Staff reported to EMS that patient fell yesterday at 1630.  Patient woke this morning stating could not stand when normally patient is able to stand with assistance and even ambulate with assistance.  Reported Hx of kyphosis and dementia.VS 136 palpated, PR 78, O2 97, and BG 120.

## 2014-12-30 NOTE — ED Notes (Signed)
Patient transported to X-ray 

## 2014-12-30 NOTE — ED Provider Notes (Signed)
CSN: [id number]     Arrival date & time 12/30/14  0730 History   First MD Initiated Contact with Patient 12/30/14 0740     Chief Complaint  Patient presents with  . Fall     (Consider location/radiation/quality/duration/timing/severity/associated sxs/prior Treatment) HPI   Lillette BoxerGracie Puig is a 81 y.o. female here for evaluation of fall. The fall apparently occurred yesterday. Today providers decided to bring her for evaluation. She can apparently usually stand with assistance, but is not doing that at this time. She is unable to contribute to history.  Level V Caveat- dementia    Past Medical History  Diagnosis Date  . Dementia in Alzheimer's disease   . Kyphosis    History reviewed. No pertinent past surgical history. No family history on file. Social History  Substance Use Topics  . Smoking status: Unknown If Ever Smoked  . Smokeless tobacco: None  . Alcohol Use: No   OB History    No data available     Review of Systems  Unable to perform ROS: Dementia      Allergies  Review of patient's allergies indicates no known allergies.  Home Medications   Prior to Admission medications   Medication Sig Start Date End Date Taking? Authorizing Provider  acetaminophen (TYLENOL) 325 MG tablet Take 650 mg by mouth every 6 (six) hours.    Yes Historical Provider, MD  docusate sodium (COLACE) 100 MG capsule Take 100 mg by mouth 2 (two) times daily.   Yes Historical Provider, MD  donepezil (ARICEPT) 10 MG tablet Take 10 mg by mouth at bedtime.   Yes Historical Provider, MD  loperamide (IMODIUM A-D) 2 MG tablet Take 2 mg by mouth daily as needed for diarrhea or loose stools.   Yes Historical Provider, MD  memantine (NAMENDA) 10 MG tablet Take 10 mg by mouth 2 (two) times daily.   Yes Historical Provider, MD  mirtazapine (REMERON) 15 MG tablet Take 15 mg by mouth at bedtime.   Yes Historical Provider, MD  polyethylene glycol (MIRALAX / GLYCOLAX) packet Take 17 g by mouth  daily.   Yes Historical Provider, MD  senna-docusate (SENOKOT-S) 8.6-50 MG per tablet Take 1 tablet by mouth 2 (two) times daily.   Yes Historical Provider, MD  Vitamin D, Ergocalciferol, (DRISDOL) 50000 UNITS CAPS Take 50,000 Units by mouth every 7 (seven) days. Taken on Thursdays   Yes Historical Provider, MD   BP 158/89 mmHg  Pulse 66  Temp(Src) 98.4 F (36.9 C) (Oral)  Resp 16  SpO2 96% Physical Exam  Constitutional: She appears well-developed.  Elderly, frail  HENT:  Head: Normocephalic and atraumatic.  Right Ear: External ear normal.  Left Ear: External ear normal.  No scalp abrasion or laceration  Eyes: Conjunctivae and EOM are normal. Pupils are equal, round, and reactive to light.  Neck: Normal range of motion and phonation normal. Neck supple.  Cardiovascular: Normal rate, regular rhythm and normal heart sounds.   Pulmonary/Chest: Effort normal and breath sounds normal. She exhibits no bony tenderness.  Abdominal: Soft. There is no tenderness.  Musculoskeletal: Normal range of motion.  Marked thoracic kyphosis. No palpable spinal tenderness, or step-off. Normal passive range of motion, arms, legs, bilaterally. No large joint swelling or deformity.  Neurological: She is alert. No cranial nerve deficit or sensory deficit. She exhibits normal muscle tone. Coordination normal.  Skin: Skin is warm, dry and intact.  Psychiatric: She has a normal mood and affect. Her behavior is normal.  Nursing note and  vitals reviewed.   ED Course  Procedures (including critical care time)  Medications - No data to display  Patient Vitals for the past 24 hrs:  BP Temp Temp src Pulse Resp SpO2  12/30/14 1538 158/89 mmHg 98.4 F (36.9 C) Oral 66 16 96 %  12/30/14 1345 153/81 mmHg - - 65 16 96 %  12/30/14 1219 155/75 mmHg - - 66 15 96 %  12/30/14 1014 153/90 mmHg - - 74 16 97 %  12/30/14 0801 - - - - - 96 %  12/30/14 0747 (!) 164/104 mmHg 98.6 F (37 C) Oral 72 16 96 %  12/30/14 0739 -  - - - - 97 %    At D/C Reevaluation with update and discussion. After initial assessment and treatment, an updated evaluation reveals no change in clinical status. Glorian Mcdonell L    Labs Review Labs Reviewed  CBC WITH DIFFERENTIAL/PLATELET - Abnormal; Notable for the following:    Hemoglobin 10.6 (*)    HCT 34.0 (*)    All other components within normal limits  URINALYSIS, ROUTINE W REFLEX MICROSCOPIC (NOT AT [facility]) - Abnormal; Notable for the following:    APPearance CLOUDY (*)    Leukocytes, UA SMALL (*)    All other components within normal limits  URINE MICROSCOPIC-ADD ON - Abnormal; Notable for the following:    Squamous Epithelial / LPF 0-5 (*)    Bacteria, UA FEW (*)    All other components within normal limits  URINE CULTURE  BASIC METABOLIC PANEL    Imaging Review Dg Chest 2 View  12/30/2014  CLINICAL DATA:  Patient fell yesterday. History of dementia. Weakness. EXAM: CHEST  2 VIEW COMPARISON:  02/23/2012. FINDINGS: The heart is enlarged. There is thoracic atherosclerosis. But no infiltrates or failure. No effusion or pneumothorax. Severe osteopenia with kyphotic angulation of the spine but no acute compression deformity. Advanced degenerative change both shoulders. IMPRESSION: Cardiomegaly. No active infiltrates or failure. Similar appearance priors. Electronically Signed   By: Elsie Stain M.D.   On: 12/30/2014 08:55   Dg Pelvis 1-2 Views  12/30/2014  CLINICAL DATA:  Fall in bathroom, initial encounter EXAM: PELVIS - 1-2 VIEW COMPARISON:  None. FINDINGS: There is slight irregularity in the superior pubic ramus on the right although this may be projectional in nature given some patient rotation. Dedicated right hip films are recommended. No other fracture is seen. IMPRESSION: Questionable changes in the right superior pubic ramus. Dedicated hip films are recommended. Electronically Signed   By: Alcide Clever M.D.   On: 12/30/2014 09:00   Ct Pelvis Wo Contrast  12/30/2014   CLINICAL DATA:  Fall going to the bathroom yesterday.  Pain. EXAM: CT PELVIS WITHOUT CONTRAST TECHNIQUE: Multidetector CT imaging of the pelvis was performed following the standard protocol without intravenous contrast. COMPARISON:  Plain films earlier today. FINDINGS: No acute bony abnormality. No evidence of pelvic fracture. In particular, the area questioned on plain films in the right superior pubic ramus is intact. Mild joint space narrowing in the hips bilaterally. No proximal femoral abnormality. IMPRESSION: No evidence of pelvic fracture. Electronically Signed   By: Charlett Nose M.D.   On: 12/30/2014 12:54   Dg Hip Unilat With Pelvis 2-3 Views Right  12/30/2014  CLINICAL DATA:  Fall yesterday.  Pain EXAM: DG HIP (WITH OR WITHOUT PELVIS) 2-3V RIGHT COMPARISON:  None. FINDINGS: Right hip joint is normal.  No fracture of the femur. Review of recent pelvis x-ray reveals a possible fracture of the  superior pubic ramus. This is not confirmed on the current study. CT of the pelvis is suggested further evaluation. IMPRESSION: Negative for fracture on the current studies. Based on concern of a superior pubic rami fracture on the right, CT pelvis is suggested. Electronically Signed   By: Marlan Palau M.D.   On: 12/30/2014 11:53   I have personally reviewed and evaluated these images and lab results as part of my medical decision-making.   EKG Interpretation None      MDM   Final diagnoses:  Fall, initial encounter  Contusion    Fall, without serious injury. No evidence for serious bacterial infection. Metabolic instability or impending vascular collapse.  Nursing Notes Reviewed/ Care Coordinated Applicable Imaging Reviewed Interpretation of Laboratory Data incorporated into ED treatment  The patient appears reasonably screened and/or stabilized for discharge and I doubt any other medical condition or other [facility] requiring further screening, evaluation, or treatment in the ED at this time prior  to discharge.  Plan: Home Medications- usual; Home Treatments- rest; return here if the recommended treatment, does not improve the symptoms; Recommended follow up- PCP prn   Mancel Bale, MD 12/30/14 [phone]

## 2014-12-30 NOTE — Discharge Instructions (Signed)
Take Tylenol for pain.  Get plenty of rest.  So not get up without help.

## 2014-12-30 NOTE — ED Notes (Signed)
Patient states that was "going to the bathroom" and fell yesterday.  Patient has a Hx of dementia.  Patient has repeated several times that can not stand and complains of weakness.

## 2014-12-31 LAB — URINE CULTURE: SPECIAL REQUESTS: NORMAL

## 2015-03-12 ENCOUNTER — Emergency Department (HOSPITAL_COMMUNITY)
Admission: EM | Admit: 2015-03-12 | Discharge: 2015-03-12 | Disposition: A | Discharge status: Home / Self Care | Payer: Medicare Other | Attending: Emergency Medicine | Admitting: Emergency Medicine

## 2015-03-12 ENCOUNTER — Emergency Department (HOSPITAL_COMMUNITY): Payer: Medicare Other

## 2015-03-12 ENCOUNTER — Encounter (HOSPITAL_COMMUNITY): Payer: Self-pay | Admitting: *Deleted

## 2015-03-12 DIAGNOSIS — R4182 Altered mental status, unspecified: Secondary | ICD-10-CM | POA: Diagnosis present

## 2015-03-12 DIAGNOSIS — Z79899 Other long term (current) drug therapy: Secondary | ICD-10-CM | POA: Diagnosis not present

## 2015-03-12 DIAGNOSIS — Z8739 Personal history of other diseases of the musculoskeletal system and connective tissue: Secondary | ICD-10-CM | POA: Diagnosis not present

## 2015-03-12 DIAGNOSIS — G309 Alzheimer's disease, unspecified: Secondary | ICD-10-CM | POA: Diagnosis not present

## 2015-03-12 DIAGNOSIS — F0280 Dementia in other diseases classified elsewhere without behavioral disturbance: Secondary | ICD-10-CM | POA: Diagnosis not present

## 2015-03-12 DIAGNOSIS — F0390 Unspecified dementia without behavioral disturbance: Secondary | ICD-10-CM

## 2015-03-12 LAB — URINALYSIS, ROUTINE W REFLEX MICROSCOPIC
Bilirubin Urine: NEGATIVE
GLUCOSE, UA: NEGATIVE mg/dL
Hgb urine dipstick: NEGATIVE
KETONES UR: 15 mg/dL — AB
Leukocytes, UA: NEGATIVE
Nitrite: NEGATIVE
Protein, ur: 30 mg/dL — AB
Specific Gravity, Urine: 1.02 (ref 1.005–1.030)
pH: 7 (ref 5.0–8.0)

## 2015-03-12 LAB — CBC WITH DIFFERENTIAL/PLATELET
BASOS PCT: 0 %
Basophils Absolute: 0 10*3/uL (ref 0.0–0.1)
EOS ABS: 0 10*3/uL (ref 0.0–0.7)
EOS PCT: 0 %
HCT: 33.9 % — ABNORMAL LOW (ref 36.0–46.0)
Hemoglobin: 10.5 g/dL — ABNORMAL LOW (ref 12.0–15.0)
Lymphocytes Relative: 14 %
Lymphs Abs: 0.6 10*3/uL — ABNORMAL LOW (ref 0.7–4.0)
MCH: 26.3 pg (ref 26.0–34.0)
MCHC: 31 g/dL (ref 30.0–36.0)
MCV: 85 fL (ref 78.0–100.0)
MONO ABS: 0.2 10*3/uL (ref 0.1–1.0)
Monocytes Relative: 5 %
Neutro Abs: 3.4 10*3/uL (ref 1.7–7.7)
Neutrophils Relative %: 81 %
PLATELETS: 180 10*3/uL (ref 150–400)
RBC: 3.99 MIL/uL (ref 3.87–5.11)
RDW: 13.5 % (ref 11.5–15.5)
WBC: 4.2 10*3/uL (ref 4.0–10.5)

## 2015-03-12 LAB — I-STAT TROPONIN, ED: TROPONIN I, POC: 0 ng/mL (ref 0.00–0.08)

## 2015-03-12 LAB — URINE MICROSCOPIC-ADD ON

## 2015-03-12 LAB — BASIC METABOLIC PANEL
Anion gap: 13 (ref 5–15)
BUN: 14 mg/dL (ref 6–20)
CALCIUM: 9.2 mg/dL (ref 8.9–10.3)
CO2: 23 mmol/L (ref 22–32)
CREATININE: 0.66 mg/dL (ref 0.44–1.00)
Chloride: 101 mmol/L (ref 101–111)
GLUCOSE: 153 mg/dL — AB (ref 65–99)
Potassium: 4 mmol/L (ref 3.5–5.1)
Sodium: 137 mmol/L (ref 135–145)

## 2015-03-12 NOTE — ED Provider Notes (Signed)
CSN: [id number]     Arrival date & time 03/12/15  1152 History   First MD Initiated Contact with Patient 03/12/15 1213     Chief Complaint  Patient presents with  . Altered Mental Status    HPI   Mandy Welch is an 82 y.o. female BIBEMS from [facility] for evaluation of AMS. History is limited due to pt's mental status. Per EMS, pt has dementia and disorientation at baseline; however, facility called for concern of confusion increased from baseline. In the ED pt is alert, though disoriented. She only verbalizes "I want to go home" and does not obey commands during our initial exam.  Past Medical History  Diagnosis Date  . Dementia in Alzheimer's disease   . Kyphosis    History reviewed. No pertinent past surgical history. No family history on file. Social History  Substance Use Topics  . Smoking status: Unknown If Ever Smoked  . Smokeless tobacco: None  . Alcohol Use: No   OB History    No data available     Review of Systems  Unable to perform ROS: Mental status change      Allergies  Review of patient's allergies indicates no known allergies.  Home Medications   Prior to Admission medications   Medication Sig Start Date End Date Taking? Authorizing Provider  acetaminophen (TYLENOL) 325 MG tablet Take 650 mg by mouth every 6 (six) hours.    Yes Historical Provider, MD  docusate sodium (COLACE) 100 MG capsule Take 100 mg by mouth 2 (two) times daily.   Yes Historical Provider, MD  donepezil (ARICEPT) 10 MG tablet Take 10 mg by mouth at bedtime.   Yes Historical Provider, MD  loperamide (IMODIUM A-D) 2 MG tablet Take 2 mg by mouth daily as needed for diarrhea or loose stools.   Yes Historical Provider, MD  memantine (NAMENDA) 10 MG tablet Take 10 mg by mouth 2 (two) times daily.   Yes Historical Provider, MD  mirtazapine (REMERON) 15 MG tablet Take 15 mg by mouth at bedtime.   Yes Historical Provider, MD  polyethylene glycol (MIRALAX / GLYCOLAX) packet Take 17 g by  mouth daily.   Yes Historical Provider, MD  senna-docusate (SENOKOT-S) 8.6-50 MG per tablet Take 1 tablet by mouth 2 (two) times daily.   Yes Historical Provider, MD   BP 167/105 mmHg  Pulse 106  Resp 20  SpO2 95% Physical Exam  Constitutional: No distress.  HENT:  Head: Atraumatic.  Right Ear: External ear normal.  Left Ear: External ear normal.  Nose: Nose normal.  Eyes: Conjunctivae and EOM are normal. Pupils are equal, round, and reactive to light. No scleral icterus.  Neck: Normal range of motion. Neck supple.  Cardiovascular: Normal rate, regular rhythm and normal heart sounds.   Pulmonary/Chest: Effort normal and breath sounds normal. No respiratory distress. She exhibits no tenderness.  Abdominal: Soft. Bowel sounds are normal. She exhibits no distension. There is no tenderness.  Neurological: She is alert.  Skin: Skin is warm and dry. She is not diaphoretic.  Psychiatric: She has a normal mood and affect. Her behavior is normal.  Nursing note and vitals reviewed.  Filed Vitals:   03/12/15 1217 03/12/15 1251 03/12/15 1335 03/12/15 1400  BP: 167/105 168/97 145/84 131/90  Pulse: 106 103 82 83  TempSrc: Oral     Resp: 20 20    SpO2: 95% 95% 94% 98%     ED Course  Procedures (including critical care time) Labs Review Labs Reviewed  BASIC METABOLIC PANEL - Abnormal; Notable for the following:    Glucose, Bld 153 (*)    All other components within normal limits  CBC WITH DIFFERENTIAL/PLATELET - Abnormal; Notable for the following:    Hemoglobin 10.5 (*)    HCT 33.9 (*)    Lymphs Abs 0.6 (*)    All other components within normal limits  URINALYSIS, ROUTINE W REFLEX MICROSCOPIC (NOT AT [facility]) - Abnormal; Notable for the following:    Ketones, ur 15 (*)    Protein, ur 30 (*)    All other components within normal limits  URINE MICROSCOPIC-ADD ON - Abnormal; Notable for the following:    Squamous Epithelial / LPF 0-5 (*)    Bacteria, UA FEW (*)    All other components  within normal limits  URINE CULTURE  I-STAT TROPOININ, ED    Imaging Review Dg Chest 2 View  03/12/2015  CLINICAL DATA:  Altered mental status EXAM: CHEST  2 VIEW COMPARISON:  December 30, 2014 FINDINGS: The degree of inspiration is somewhat shallow. There is no edema or consolidation. Heart size and pulmonary vascularity are within normal limits. No adenopathy. There is degenerative change in the thoracic spine with increase kyphosis. There is degenerative change in each shoulder. IMPRESSION: No edema or consolidation. Electronically Signed   By: Bretta Bang III M.D.   On: 03/12/2015 13:19   I have personally reviewed and evaluated these images and lab results as part of my medical decision-making.   EKG Interpretation None      MDM   Final diagnoses:  Dementia, without behavioral disturbance    Labs unremarkable and unrevealing for cause of AMS. I have tried several times but been unable to speak with a representative from [facility] to get a better sense of pt's baseline mental status and their concerns. At this time pt is resting comfortably, VSS, nontoxic. Will d/c home with instructions to f/u with PCP with ER return precautions.     Carlene Coria, PA-C 03/12/15 1444  Arby Barrette, MD 03/12/15 1540

## 2015-03-12 NOTE — Discharge Instructions (Signed)
Mandy Welch labs were unremarkable. Her urine had a tiny bit of bacteria but no evidence of true infection. We will send her urine for culture. Please follow up with her primary care provider early next week. Return to the ER for new or worsening symptoms.

## 2015-03-12 NOTE — ED Notes (Signed)
Patient is from [facility] and she was sent out for suspected UTI. Patient has history of alzheimer's and dementia. Patient is more confused that usual according to nursing staff.

## 2015-03-12 NOTE — ED Notes (Signed)
Bed: WA04 Expected date:  Expected time:  Means of arrival:  Comments: EMS 

## 2015-03-12 NOTE — ED Notes (Signed)
Bed: WHALA Expected date:  Expected time:  Means of arrival:  Comments: 

## 2015-03-14 LAB — URINE CULTURE: CULTURE: NO GROWTH

## 2015-08-24 ENCOUNTER — Encounter: Payer: Self-pay | Admitting: Adult Health

## 2015-08-24 ENCOUNTER — Non-Acute Institutional Stay (SKILLED_NURSING_FACILITY): Payer: Medicare Other | Admitting: Adult Health

## 2015-08-24 DIAGNOSIS — F0150 Vascular dementia without behavioral disturbance: Secondary | ICD-10-CM | POA: Diagnosis not present

## 2015-08-24 DIAGNOSIS — F329 Major depressive disorder, single episode, unspecified: Secondary | ICD-10-CM | POA: Diagnosis not present

## 2015-08-24 DIAGNOSIS — R627 Adult failure to thrive: Secondary | ICD-10-CM | POA: Diagnosis not present

## 2015-08-24 DIAGNOSIS — F32A Depression, unspecified: Secondary | ICD-10-CM

## 2015-08-24 DIAGNOSIS — E559 Vitamin D deficiency, unspecified: Secondary | ICD-10-CM

## 2015-08-24 DIAGNOSIS — K5901 Slow transit constipation: Secondary | ICD-10-CM

## 2015-08-24 HISTORY — DX: Slow transit constipation: K59.01

## 2015-08-24 NOTE — Progress Notes (Signed)
Patient ID: Mandy Welch, female   DOB: 1918-10-25, 82 y.o.   MRN: [id number]   Location:   [facility] Room Number: 113-B Place of Service:  SNF (31)   CODE STATUS: Full Code until otherwise determined  No Known Allergies  Chief Complaint  Patient presents with  . Hospitalization Follow-up    Hospital follow up    HPI:  She has been transferred from assisted living to this facility.  She is unable to fully participate in the hpi or ros; but tell me that she felt good. There are no nursing concerns at this time. This does represent a long term placement for her.    Past Medical History:  Diagnosis Date  . Dementia in Alzheimer's disease   . Kyphosis     History reviewed. No pertinent surgical history.  Social History   Social History  . Marital status: Single    Spouse name: N/A  . Number of children: N/A  . Years of education: N/A   Occupational History  . Not on file.   Social History Main Topics  . Smoking status: Unknown If Ever Smoked  . Smokeless tobacco: Not on file  . Alcohol use No  . Drug use: No  . Sexual activity: Not on file   Other Topics Concern  . Not on file   Social History Narrative  . No narrative on file   History reviewed. No pertinent family history.    VITAL SIGNS There were no vitals taken for this visit.  Patient's Medications  New Prescriptions   No medications on file  Previous Medications   ACETAMINOPHEN (TYLENOL) 325 MG TABLET    Take 650 mg by mouth 2 (two) times daily.    DOCUSATE SODIUM (COLACE) 100 MG CAPSULE    Take 100 mg by mouth 2 (two) times daily.   DONEPEZIL (ARICEPT) 10 MG TABLET    Take 10 mg by mouth at bedtime.   LORAZEPAM (ATIVAN) 0.5 MG TABLET    Take 0.5 mg by mouth every 12 (twelve) hours as needed for anxiety.   MEMANTINE (NAMENDA) 10 MG TABLET    Take 10 mg by mouth 2 (two) times daily.   MIRTAZAPINE (REMERON) 15 MG TABLET    Take 15 mg by mouth at bedtime.   POLYETHYLENE GLYCOL  (MIRALAX / GLYCOLAX) PACKET    Take 17 g by mouth daily.   SENNA-DOCUSATE (SENOKOT-S) 8.6-50 MG PER TABLET    Take 1 tablet by mouth 2 (two) times daily.   VITAMIN D, ERGOCALCIFEROL, (DRISDOL) 50000 UNITS CAPS CAPSULE    Take 50,000 Units by mouth every 30 (thirty) days.  Modified Medications   No medications on file  Discontinued Medications   LOPERAMIDE (IMODIUM A-D) 2 MG TABLET    Take 2 mg by mouth daily as needed for diarrhea or loose stools.     SIGNIFICANT DIAGNOSTIC EXAMS   LABS REVIEWED:   03-12-15: wbc 4.2; hgb 10.5; hct 33.9; mcv 85.0; plt 180; glucose 153; bun 14; creat 0.66; k+ 4.0; na++ 137    Review of Systems  Unable to perform ROS: Dementia   Physical Exam  Constitutional: No distress.  Frail   Eyes: Conjunctivae are normal.  Neck: Neck supple. No JVD present. No thyromegaly present.  Cardiovascular: Normal rate, regular rhythm and intact distal pulses.   Respiratory: Effort normal and breath sounds normal. No respiratory distress. She has no wheezes.  GI: Soft. Bowel sounds are normal. She exhibits no distension. There is no  tenderness.  Musculoskeletal: She exhibits edema.  Able to move all extremities  Has kyphosis Has bilateral trace lower extremity edema   Lymphadenopathy:    She has no cervical adenopathy.  Neurological: She is alert.  Skin: Skin is warm and dry. She is not diaphoretic.  Psychiatric: She has a normal mood and affect.      ASSESSMENT/ PLAN:  1. Vascular dementia; is without change in her status; will continue namenda 10 mg twice daily and aricept 10 mg daily will continue to monitor her status.   2. Depression: is presently stable is taking remeron 15 mg nightly will monitor   3. Constipation: will continue miralax daily and senna twice daily   4. Vit D deficiency: will continue vit d 50,000 units monthly   5. Failure to thrive: will continue to monitor her weight; will provide supplements per facility protocol    The cbc;  cmp pending   Time spent with patient  50  minutes >50% time spent counseling; reviewing medical record; tests; labs; and developing future plan of care     Synthia Innocent NP [facility]  Contact [phone] Monday through Friday 8am- 5pm  After hours call [phone]

## 2015-08-26 ENCOUNTER — Encounter: Payer: Self-pay | Admitting: Internal Medicine

## 2015-08-26 ENCOUNTER — Non-Acute Institutional Stay (SKILLED_NURSING_FACILITY): Payer: Medicare Other | Admitting: Internal Medicine

## 2015-08-26 DIAGNOSIS — K5901 Slow transit constipation: Secondary | ICD-10-CM | POA: Diagnosis not present

## 2015-08-26 DIAGNOSIS — F0150 Vascular dementia without behavioral disturbance: Secondary | ICD-10-CM | POA: Diagnosis not present

## 2015-08-26 DIAGNOSIS — F329 Major depressive disorder, single episode, unspecified: Secondary | ICD-10-CM

## 2015-08-26 DIAGNOSIS — E559 Vitamin D deficiency, unspecified: Secondary | ICD-10-CM

## 2015-08-26 DIAGNOSIS — F0390 Unspecified dementia without behavioral disturbance: Secondary | ICD-10-CM | POA: Insufficient documentation

## 2015-08-26 DIAGNOSIS — R627 Adult failure to thrive: Secondary | ICD-10-CM | POA: Diagnosis not present

## 2015-08-26 DIAGNOSIS — F32A Depression, unspecified: Secondary | ICD-10-CM

## 2015-08-26 NOTE — Progress Notes (Signed)
MRN: [id number] Name: Mandy Welch  Sex: female Age: 82 y.o. DOB: 15-Sep-1918  PSC #:  Facility/Room: [facility] / 113 B Level Of Care: SNF Provider: Randon Goldsmith. Lyn Hollingshead, MD Emergency Contacts: Extended Emergency Contact Information Primary Emergency Contact: Koloski,Annette Address: [address]          [place], [place] [address] [place] Home Phone: [phone] Relation: Other Secondary Emergency Contact: Gilleland,Cedric Address: [address]          [place], [place] [address] [place] Home Phone: [phone] Relation: Grandson  Code Status: Full Code  Allergies: Review of patient's allergies indicates no known allergies.  Chief Complaint  Patient presents with  . New Admit To SNF    Admit to Facility    HPI: Patient is 82 y.o. female who is admitted to SNF on 7/31 from ALF because she needs a higher level of care 2/2 dementia. There is no other info available. Pt cannot offer any HPI.  Past Medical History:  Diagnosis Date  . Constipation   . Dementia in Alzheimer's disease   . Kyphosis   . Vascular dementia without behavioral disturbance   . Vitamin D deficiency     No past surgical history on file.    Medication List       Accurate as of 08/26/15 11:59 PM. Always use your most recent med list.          acetaminophen 325 MG tablet Commonly known as:  TYLENOL Take 650 mg by mouth every 6 (six) hours as needed for mild pain.   docusate sodium 100 MG capsule Commonly known as:  COLACE Take 100 mg by mouth 2 (two) times daily.   donepezil 10 MG tablet Commonly known as:  ARICEPT Take 10 mg by mouth at bedtime.   LORazepam 0.5 MG tablet Commonly known as:  ATIVAN Take 0.5 mg by mouth every 12 (twelve) hours as needed for anxiety.   memantine 10 MG tablet Commonly known as:  NAMENDA Take 10 mg by mouth 2 (two) times daily.   mirtazapine 15 MG tablet Commonly known as:  REMERON Take 15 mg by mouth at  bedtime.   polyethylene glycol packet Commonly known as:  MIRALAX / GLYCOLAX Take 17 g by mouth daily.   senna-docusate 8.6-50 MG tablet Commonly known as:  Senokot-S Take 1 tablet by mouth 2 (two) times daily.   Vitamin D (Ergocalciferol) 50000 units Caps capsule Commonly known as:  DRISDOL Take 50,000 Units by mouth every 30 (thirty) days.       No orders of the defined types were placed in this encounter.    There is no immunization history on file for this patient.  Social History  Substance Use Topics  . Smoking status: Unknown If Ever Smoked  . Smokeless tobacco: Not on file  . Alcohol use No    Family history is UTO 2/2 dementia  Family History  Problem Relation Age of Onset  . Family history unknown: Yes      Review of Systems  UTO 2/2 dementia    Vitals:   08/26/15 0938  BP: 118/62  Pulse: 76  Resp: 16  Temp: 98.4 F (36.9 C)    SpO2 Readings from Last 1 Encounters:  08/26/15 95%        Physical Exam  GENERAL APPEARANCE: Alert, conversant,  No acute distress.  SKIN: No diaphoresis rash HEAD: Normocephalic, atraumatic  EYES: Conjunctiva/lids clear. Pupils round, reactive. EOMs intact.  EARS: External exam  WNL, canals clear. Hearing grossly normal.  NOSE: No deformity or discharge.  MOUTH/THROAT: Lips w/o lesions  RESPIRATORY: Breathing is even, unlabored. Lung sounds are clear   CARDIOVASCULAR: Heart RRR no murmurs, rubs or gallops. trace peripheral edema.   GASTROINTESTINAL: Abdomen is soft, non-tender, not distended w/ normal bowel sounds. GENITOURINARY: Bladder non tender, not distended  MUSCULOSKELETAL: kyphosis NEUROLOGIC:  Cranial nerves 2-12 grossly intact. Moves all extremities  PSYCHIATRIC: dementia, no behavioral issues  Patient Active Problem List   Diagnosis Date Noted  . Dementia   . Failure to thrive in adult 08/24/2015  . Slow transit constipation 08/24/2015  . Vitamin D deficiency 08/24/2015  . Vascular dementia  without behavioral disturbance 08/24/2015  . Depression 08/24/2015       Component Value Date/Time   WBC 4.2 03/12/2015 1234   RBC 3.99 03/12/2015 1234   HGB 10.5 (L) 03/12/2015 1234   HCT 33.9 (L) 03/12/2015 1234   PLT 180 03/12/2015 1234   MCV 85.0 03/12/2015 1234   LYMPHSABS 0.6 (L) 03/12/2015 1234   MONOABS 0.2 03/12/2015 1234   EOSABS 0.0 03/12/2015 1234   BASOSABS 0.0 03/12/2015 1234        Component Value Date/Time   NA 137 03/12/2015 1234   K 4.0 03/12/2015 1234   CL 101 03/12/2015 1234   CO2 23 03/12/2015 1234   GLUCOSE 153 (H) 03/12/2015 1234   BUN 14 03/12/2015 1234   CREATININE 0.66 03/12/2015 1234   CALCIUM 9.2 03/12/2015 1234   PROT 7.3 02/18/2013 1350   ALBUMIN 3.7 02/18/2013 1350   AST 26 02/18/2013 1350   ALT 12 02/18/2013 1350   ALKPHOS 63 02/18/2013 1350   BILITOT 0.2 (L) 02/18/2013 1350   GFRNONAA >60 03/12/2015 1234   GFRAA >60 03/12/2015 1234    No results found for: HGBA1C  No results found for: CHOL, HDL, LDLCALC, LDLDIRECT, TRIG, CHOLHDL   Dg Chest 2 View  Result Date: 03/12/2015 CLINICAL DATA:  Altered mental status EXAM: CHEST  2 VIEW COMPARISON:  December 30, 2014 FINDINGS: The degree of inspiration is somewhat shallow. There is no edema or consolidation. Heart size and pulmonary vascularity are within normal limits. No adenopathy. There is degenerative change in the thoracic spine with increase kyphosis. There is degenerative change in each shoulder. IMPRESSION: No edema or consolidation. Electronically Signed   By: Bretta Bang III M.D.   On: 03/12/2015 13:19    Not all labs, radiology exams or other studies done during hospitalization come through on my EPIC note; however they are reviewed by me.    Assessment and Plan  Vascular dementia without behavioral disturbance Cont namenda 10 mg BID and aricept 10 mg daily  Failure to thrive in adult Reported; will follow with appetite, level of activity and weights    Depression Stable; cont remeron 15 mg qHS  Vitamin D deficiency Cont 50,000u weekly; will f/i Vit D level  Slow transit constipation Cont extensive regimen of colace BID, senokot S BID and miralax 17 gm daily  I have reviewed EPIC chart for any further diagnoses or labs for this pt and new significant problems/information was found   Time spent > 45 min;> 50% of time with patient was spent reviewing records, labs, tests and studies, counseling and developing plan of care  Thurston Hole D. Lyn Hollingshead, MD

## 2015-08-27 LAB — BASIC METABOLIC PANEL
BUN: 20 mg/dL (ref 4–21)
CREATININE: 0.7 mg/dL (ref 0.5–1.1)
Glucose: 116 mg/dL
POTASSIUM: 4.1 mmol/L (ref 3.4–5.3)
Sodium: 143 mmol/L (ref 137–147)

## 2015-08-27 LAB — CBC AND DIFFERENTIAL
HCT: 37 % (ref 36–46)
Hemoglobin: 11 g/dL — AB (ref 12.0–16.0)
Platelets: 189 10*3/uL (ref 150–399)
WBC: 7.1 10*3/mL

## 2015-08-27 LAB — HEPATIC FUNCTION PANEL
ALT: 9 U/L (ref 7–35)
AST: 16 U/L (ref 13–35)
Alkaline Phosphatase: 61 U/L (ref 25–125)
Bilirubin, Total: 0.2 mg/dL

## 2015-08-29 NOTE — Assessment & Plan Note (Signed)
Reported; will follow with appetite, level of activity and weights

## 2015-08-29 NOTE — Assessment & Plan Note (Signed)
Cont 50,000u weekly; will f/i Vit D level

## 2015-08-29 NOTE — Assessment & Plan Note (Signed)
Cont namenda 10 mg BID and aricept 10 mg daily

## 2015-08-29 NOTE — Assessment & Plan Note (Signed)
Cont extensive regimen of colace BID, senokot S BID and miralax 17 gm daily

## 2015-08-29 NOTE — Assessment & Plan Note (Signed)
Stable; cont remeron 15 mg qHS

## 2015-09-29 ENCOUNTER — Encounter: Payer: Self-pay | Admitting: Adult Health

## 2015-09-29 ENCOUNTER — Non-Acute Institutional Stay (SKILLED_NURSING_FACILITY): Payer: Medicare Other | Admitting: Adult Health

## 2015-09-29 DIAGNOSIS — R627 Adult failure to thrive: Secondary | ICD-10-CM | POA: Diagnosis not present

## 2015-09-29 DIAGNOSIS — F329 Major depressive disorder, single episode, unspecified: Secondary | ICD-10-CM | POA: Diagnosis not present

## 2015-09-29 DIAGNOSIS — K5901 Slow transit constipation: Secondary | ICD-10-CM | POA: Diagnosis not present

## 2015-09-29 DIAGNOSIS — F0150 Vascular dementia without behavioral disturbance: Secondary | ICD-10-CM

## 2015-09-29 DIAGNOSIS — F32A Depression, unspecified: Secondary | ICD-10-CM

## 2015-09-29 NOTE — Progress Notes (Signed)
Patient ID: Mandy Welch, female   DOB: 02/26/1918, 82 y.o.   MRN: [id number]    Location:   [facility] Room Number: 113-B Place of Service:  SNF (31)   CODE STATUS: Full Code  No Known Allergies  Chief Complaint  Patient presents with  . Medical Management of Chronic Issues    Follow up    HPI:  She is a long term resident of this facility being seen for the management of her chronic illnesses. Overall there is little change in her status. Her weight is 99 pounds. She is unable to fully participate in the hpi or ros. There are no nursing concerns at this time.    Past Medical History:  Diagnosis Date  . Constipation   . Dementia in Alzheimer's disease   . Kyphosis   . Vascular dementia without behavioral disturbance   . Vitamin D deficiency     History reviewed. No pertinent surgical history.  Social History   Social History  . Marital status: Single    Spouse name: N/A  . Number of children: N/A  . Years of education: N/A   Occupational History  . Not on file.   Social History Main Topics  . Smoking status: Unknown If Ever Smoked  . Smokeless tobacco: Not on file  . Alcohol use No  . Drug use: No  . Sexual activity: Not on file   Other Topics Concern  . Not on file   Social History Narrative  . No narrative on file   Family History  Problem Relation Age of Onset  . Family history unknown: Yes      VITAL SIGNS BP 118/62   Pulse 76   Temp 98.4 F (36.9 C) (Oral)   Resp 16   Ht 4\' 6"  (1.372 m)   Wt 99 lb 2 oz (45 kg)   SpO2 95%   BMI 23.90 kg/m   Patient's Medications  New Prescriptions   No medications on file  Previous Medications   ACETAMINOPHEN (TYLENOL) 325 MG TABLET    Take 650 mg by mouth every 6 (six) hours as needed for mild pain.    DOCUSATE SODIUM (COLACE) 100 MG CAPSULE    Take 100 mg by mouth 2 (two) times daily.   DONEPEZIL (ARICEPT) 10 MG TABLET    Take 10 mg by mouth at bedtime.   MEMANTINE (NAMENDA) 10  MG TABLET    Take 10 mg by mouth 2 (two) times daily.   MIRTAZAPINE (REMERON) 15 MG TABLET    Take 15 mg by mouth at bedtime.   POLYETHYLENE GLYCOL (MIRALAX / GLYCOLAX) PACKET    Take 17 g by mouth daily.   SENNA-DOCUSATE (SENOKOT-S) 8.6-50 MG PER TABLET    Take 1 tablet by mouth 2 (two) times daily.  Modified Medications   No medications on file  Discontinued Medications   LORAZEPAM (ATIVAN) 0.5 MG TABLET    Take 0.5 mg by mouth every 12 (twelve) hours as needed for anxiety.   VITAMIN D, ERGOCALCIFEROL, (DRISDOL) 50000 UNITS CAPS CAPSULE    Take 50,000 Units by mouth every 30 (thirty) days.     SIGNIFICANT DIAGNOSTIC EXAMS  LABS REVIEWED:   03-12-15: wbc 4.2; hgb 10.5; hct 33.9; mcv 85.0; plt 180; glucose 153; bun 14; creat 0.66; k+ 4.0; na++ 137  08-27-15: wbc 7.1 hgb 11.0; hct 37.4; mcv 83.6; plt 189; glucose 116; bun 20.2; creat 0.68; k+ 4.1; na++ 143; liver normal albumin 4.3    Review  of Systems  Unable to perform ROS: Dementia   Physical Exam  Constitutional: No distress.  Frail   Eyes: Conjunctivae are normal.  Neck: Neck supple. No JVD present. No thyromegaly present.  Cardiovascular: Normal rate, regular rhythm and intact distal pulses.   Respiratory: Effort normal and breath sounds normal. No respiratory distress. She has no wheezes.  GI: Soft. Bowel sounds are normal. She exhibits no distension. There is no tenderness.  Musculoskeletal: She exhibits edema.  Able to move all extremities  Has kyphosis Has bilateral trace lower extremity edema   Lymphadenopathy:    She has no cervical adenopathy.  Neurological: She is alert.  Skin: Skin is warm and dry. She is not diaphoretic.  Psychiatric: She has a normal mood and affect.      ASSESSMENT/ PLAN:  1. Vascular dementia; is without change in her status; will continue namenda 10 mg twice daily   Will lower her aricept to 5 mg nightly due to her low body weight.   2. Depression: is presently stable is taking remeron  15 mg nightly will monitor   3. Constipation: will continue miralax daily and senna s twice daily   4. Vit D deficiency: is not on supplement; will monitor  5. Failure to thrive: will continue to monitor her  current weight is 99 pounds; will provide supplements per facility protocol      Synthia Innocent NP [facility]  Contact [phone] Monday through Friday 8am- 5pm  After hours call [phone]

## 2015-10-26 ENCOUNTER — Non-Acute Institutional Stay (SKILLED_NURSING_FACILITY): Payer: Medicare Other | Admitting: Internal Medicine

## 2015-10-26 DIAGNOSIS — F32A Depression, unspecified: Secondary | ICD-10-CM

## 2015-10-26 DIAGNOSIS — F329 Major depressive disorder, single episode, unspecified: Secondary | ICD-10-CM | POA: Diagnosis not present

## 2015-10-26 DIAGNOSIS — R627 Adult failure to thrive: Secondary | ICD-10-CM

## 2015-10-26 DIAGNOSIS — K5901 Slow transit constipation: Secondary | ICD-10-CM | POA: Diagnosis not present

## 2015-10-26 DIAGNOSIS — F0150 Vascular dementia without behavioral disturbance: Secondary | ICD-10-CM | POA: Diagnosis not present

## 2015-10-26 NOTE — Progress Notes (Signed)
Location:   [facility] Room Number: 113-B Place of Service:  SNF (31)  This is routine visit.  Chief complaint-medical management of chronic medical issues including dementia-failure to thrive-depression.  History of present illness.  Patient is a frail 82 year old female with the above diagnoses-nursing staff does not report any recent acute issues.  She does have a history of vascular dementia which apparently is unchanged-she is on Namenda-as well as Aricept-her weight is 96.6 which is down a couple pounds from last month-.  Her Aricept was recently decreased secondary to her low body weight.  She cannot really give any review of systems secondary to significant dementia.  Past Medical History:  Diagnosis Date  . Constipation   . Dementia in Alzheimer's disease   . Kyphosis   . Vascular dementia without behavioral disturbance   . Vitamin D deficiency     History reviewed. No pertinent surgical history.  Social History        Social History  . Marital status: Single    Spouse name: N/A  . Number of children: N/A  . Years of education: N/A      Occupational History  . Not on file.       Social History Main Topics  . Smoking status: Unknown If Ever Smoked  . Smokeless tobacco: Not on file  . Alcohol use No  . Drug use: No  . Sexual activity: Not on file       Other Topics Concern  . Not on file      Social History Narrative  . No narrative on file        Family History  Problem Relation Age of Onset  . Family history unknown: Yes      VITAL SIGNS  Temperature is 98.4 pulse 80respirations 16 blood pressure 118/62 weight is 96.6 pounds       Patient's Medications  New Prescriptions   No medications on file  Previous Medications   ACETAMINOPHEN (TYLENOL) 325 MG TABLET    Take 650 mg by mouth every 6 (six) hours as needed for mild pain.    DOCUSATE SODIUM (COLACE) 100 MG CAPSULE    Take 100 mg by mouth 2  (two) times daily.   DONEPEZIL (ARICEPT) 5 MG TABLET    Take 5 mg by mouth at bedtime.   MEMANTINE (NAMENDA) 10 MG TABLET    Take 10 mg by mouth 2 (two) times daily.   MIRTAZAPINE (REMERON) 15 MG TABLET    Take 15 mg by mouth at bedtime.   POLYETHYLENE GLYCOL (MIRALAX / GLYCOLAX) PACKET    Take 17 g by mouth daily.   SENNA-DOCUSATE (SENOKOT-S) 8.6-50 MG PER TABLET    Take 1 tablet by mouth 2 (two) times daily.  Modified Medications   No medications on file  Discontinued Medications   LORAZEPAM (ATIVAN) 0.5 MG TABLET    Take 0.5 mg by mouth every 12 (twelve) hours as needed for anxiety.   VITAMIN D, ERGOCALCIFEROL, (DRISDOL) 50000 UNITS CAPS CAPSULE    Take 50,000 Units by mouth every 30 (thirty) days.     SIGNIFICANT DIAGNOSTIC EXAMS  LABS REVIEWED:   03-12-15: wbc 4.2; hgb 10.5; hct 33.9; mcv 85.0; plt 180; glucose 153; bun 14; creat 0.66; k+ 4.0; na++ 137  08-27-15: wbc 7.1 hgb 11.0; hct 37.4; mcv 83.6; plt 189; glucose 116; bun 20.2; creat 0.68; k+ 4.1; na++ 143; liver normal albumin 4.3     Review of Systems  Unable to perform  ROS: Dementia   Physical Exam  Constitutional: No distress. She is alert responsive  Frail   Eyes: Conjunctivae are normal.  Neck: Neck supple. No JVD present. No thyromegaly present.  Cardiovascular: Normal rate, regular rhythm and intact distal pulses.   Respiratory: Effort normal and breath sounds normal. No respiratory distress. She has no wheezes.  GI: Soft. Bowel sounds are normal. She exhibits no distension. There is no tenderness.  Musculoskeletal: Inderal frailty-does move all extremities diffuse arthritic changes   Has kyphosis Has bilateral trace lower extremity edema   Lymphadenopathy:    She has no cervical adenopathy.  Neurological: She is alert.  Skin: Skin is warm and dry. She is not diaphoretic.  Psychiatric: She has a normal mood and affect.      ASSESSMENT/ PLAN:  1. Vascular dementia; is without change  in her status; will continue namenda 10 mg twice daily -aerosol has been lowered to 5 mg a day secondary to low body weight she appears to be relatively stable with supportive care.   2. Depression: is presently stable is taking remeron 15 mg nightly will monitor   3. Constipation: will continue miralax daily and senna s twice daily   4. Vit D deficiency: is not on supplement; will monitor  5. Failure to thrive: will continue to monitor her  current weight is 96 pounds; will provide supplements per facility protocol -also will update a metabolic panel note her albumin actually was 4.3 on lab done in August.  [id number]

## 2015-10-27 LAB — BASIC METABOLIC PANEL
BUN: 15 mg/dL (ref 4–21)
CREATININE: 0.6 mg/dL (ref 0.5–1.1)
Glucose: 82 mg/dL
POTASSIUM: 4.3 mmol/L (ref 3.4–5.3)
SODIUM: 144 mmol/L (ref 137–147)

## 2015-10-27 LAB — HEPATIC FUNCTION PANEL
ALK PHOS: 59 U/L (ref 25–125)
ALT: 9 U/L (ref 7–35)
AST: 16 U/L (ref 13–35)
Bilirubin, Total: 0.3 mg/dL

## 2015-11-24 ENCOUNTER — Encounter: Payer: Self-pay | Admitting: Adult Health

## 2015-11-24 ENCOUNTER — Non-Acute Institutional Stay (SKILLED_NURSING_FACILITY): Payer: Medicare Other | Admitting: Adult Health

## 2015-11-24 DIAGNOSIS — F0150 Vascular dementia without behavioral disturbance: Secondary | ICD-10-CM

## 2015-11-24 DIAGNOSIS — F329 Major depressive disorder, single episode, unspecified: Secondary | ICD-10-CM

## 2015-11-24 DIAGNOSIS — R627 Adult failure to thrive: Secondary | ICD-10-CM

## 2015-11-24 DIAGNOSIS — F32A Depression, unspecified: Secondary | ICD-10-CM

## 2015-11-24 DIAGNOSIS — K5901 Slow transit constipation: Secondary | ICD-10-CM | POA: Diagnosis not present

## 2015-11-24 NOTE — Progress Notes (Signed)
Patient ID: Mandy Welch, female   DOB: 03/09/1918, 82 y.o.   MRN: [id number]   Location:   [facility] Room Number: 113-B Place of Service:  SNF (31)   CODE STATUS: DNR  No Known Allergies  Chief Complaint  Patient presents with  . Medical Management of Chronic Issues    Follow up    HPI:  She is a long term resident of this facility being seen for the management of her chronic illnesses. Overall her status is without significant change; her weight is 82 pounds. She is unable to participate in the hpi or ros. There are no nursing concerns at this time.   Past Medical History:  Diagnosis Date  . Constipation   . Dementia in Alzheimer's disease   . Kyphosis   . Slow transit constipation 08/24/2015  . Vascular dementia without behavioral disturbance   . Vitamin D deficiency     History reviewed. No pertinent surgical history.  Social History   Social History  . Marital status: Single    Spouse name: N/A  . Number of children: N/A  . Years of education: N/A   Occupational History  . Not on file.   Social History Main Topics  . Smoking status: Unknown If Ever Smoked  . Smokeless tobacco: Not on file  . Alcohol use No  . Drug use: No  . Sexual activity: Not on file   Other Topics Concern  . Not on file   Social History Narrative  . No narrative on file   Family History  Problem Relation Age of Onset  . Family history unknown: Yes      VITAL SIGNS BP 118/62   Pulse 76   Temp 97 F (36.1 C) (Oral)   Resp 16   Ht 4\' 6"  (1.372 m)   Wt 82 lb 6 oz (37.4 kg)   SpO2 95%   BMI 19.86 kg/m   Patient's Medications  New Prescriptions   No medications on file  Previous Medications   ACETAMINOPHEN (TYLENOL) 325 MG TABLET    Take 650 mg by mouth every 6 (six) hours as needed for mild pain.    DOCUSATE SODIUM (COLACE) 100 MG CAPSULE    Take 100 mg by mouth 2 (two) times daily.   DONEPEZIL (ARICEPT) 10 MG TABLET    Take 5 mg by mouth at  bedtime.    MEMANTINE (NAMENDA) 10 MG TABLET    Take 10 mg by mouth 2 (two) times daily.   MIRTAZAPINE (REMERON) 15 MG TABLET    Take 15 mg by mouth at bedtime.   POLYETHYLENE GLYCOL (MIRALAX / GLYCOLAX) PACKET    Take 17 g by mouth daily.   SENNA-DOCUSATE (SENOKOT-S) 8.6-50 MG PER TABLET    Take 1 tablet by mouth 2 (two) times daily.  Modified Medications   No medications on file  Discontinued Medications   No medications on file     SIGNIFICANT DIAGNOSTIC EXAMS  LABS REVIEWED:   03-12-15: wbc 4.2; hgb 10.5; hct 33.9; mcv 85.0; plt 180; glucose 153; bun 14; creat 0.66; k+ 4.0; na++ 137  08-27-15: wbc 7.1 hgb 11.0; hct 37.4; mcv 83.6; plt 189; glucose 116; bun 20.2; creat 0.68; k+ 4.1; na++ 143; liver normal albumin 4.3  10-27-15: wbc 7.1; hgb 11.0; hct 37.4; mcv 83.6; plt 189; glucose 82; bun 14.9; creat 0.62; k+ 4.3; na++ 144; liver normal albumin 3.7     Review of Systems  Unable to perform ROS: Dementia  Physical Exam  Constitutional: No distress.  Frail   Eyes: Conjunctivae are normal.  Neck: Neck supple. No JVD present. No thyromegaly present.  Cardiovascular: Normal rate, regular rhythm and intact distal pulses.   Respiratory: Effort normal and breath sounds normal. No respiratory distress. She has no wheezes.  GI: Soft. Bowel sounds are normal. She exhibits no distension. There is no tenderness.  Musculoskeletal: She exhibits edema.  Able to move all extremities  Has kyphosis Has bilateral trace lower extremity edema   Lymphadenopathy:    She has no cervical adenopathy.  Neurological: She is alert.  Skin: Skin is warm and dry. She is not diaphoretic.  Psychiatric: She has a normal mood and affect.      ASSESSMENT/ PLAN:  1. Vascular dementia; is without change in her status; will continue namenda 10 mg twice daily   Will stop her Aricept due to her continued weight loss. Weight is is an unfortunate out come in the end stage of the dementia.   2. Depression: is  presently stable is taking remeron 15 mg nightly will monitor   3. Constipation: will continue miralax daily and senna s twice daily   4. Vit D deficiency: is not on supplement; will monitor  5. Failure to thrive: will continue to monitor her  current weight is 82 pounds; will provide supplements per facility protocol        Synthia Innocenteborah Maurica Omura NP [facility]  Contact [phone] Monday through Friday 8am- 5pm  After hours call [phone]

## 2015-12-29 ENCOUNTER — Encounter: Payer: Self-pay | Admitting: Adult Health

## 2015-12-29 ENCOUNTER — Non-Acute Institutional Stay (SKILLED_NURSING_FACILITY): Payer: Medicare Other | Admitting: Adult Health

## 2015-12-29 DIAGNOSIS — F329 Major depressive disorder, single episode, unspecified: Secondary | ICD-10-CM | POA: Diagnosis not present

## 2015-12-29 DIAGNOSIS — E559 Vitamin D deficiency, unspecified: Secondary | ICD-10-CM

## 2015-12-29 DIAGNOSIS — K5901 Slow transit constipation: Secondary | ICD-10-CM | POA: Diagnosis not present

## 2015-12-29 DIAGNOSIS — F32A Depression, unspecified: Secondary | ICD-10-CM

## 2015-12-29 DIAGNOSIS — F0150 Vascular dementia without behavioral disturbance: Secondary | ICD-10-CM

## 2015-12-29 DIAGNOSIS — R627 Adult failure to thrive: Secondary | ICD-10-CM | POA: Diagnosis not present

## 2015-12-29 NOTE — Progress Notes (Signed)
Patient ID: Mandy Welch, female   DOB: 03/13/1918, 82 y.o.   MRN: [id number]   Location:   [facility] Room Number: 113-B Place of Service:  SNF (31)   CODE STATUS: DNR  No Known Allergies  Chief Complaint  Patient presents with  . Medical Management of Chronic Issues    Follow up    HPI:  She is a long term resident of this facility being seen for the management of her chronic illnesses. Overall there is little change in her status.  Her weight is stable at 81 pounds. She is unable to participate in the hpi or ros. There are no nursing concerns at this time. She does continue to get out of bed on a daily basis.    Past Medical History:  Diagnosis Date  . Constipation   . Dementia in Alzheimer's disease   . Kyphosis   . Slow transit constipation 08/24/2015  . Vascular dementia without behavioral disturbance   . Vitamin D deficiency     History reviewed. No pertinent surgical history.  Social History   Social History  . Marital status: Single    Spouse name: N/A  . Number of children: N/A  . Years of education: N/A   Occupational History  . Not on file.   Social History Main Topics  . Smoking status: Unknown If Ever Smoked  . Smokeless tobacco: Not on file  . Alcohol use No  . Drug use: No  . Sexual activity: Not on file   Other Topics Concern  . Not on file   Social History Narrative  . No narrative on file   Family History  Problem Relation Age of Onset  . Family history unknown: Yes      VITAL SIGNS BP (!) 115/52   Pulse 100   Temp 97.9 F (36.6 C) (Oral)   Resp 16   Ht 4\' 6"  (1.372 m)   Wt 81 lb 2 oz (36.8 kg)   SpO2 97%   BMI 19.56 kg/m   Patient's Medications  New Prescriptions   No medications on file  Previous Medications   ACETAMINOPHEN (TYLENOL) 325 MG TABLET    Take 650 mg by mouth every 6 (six) hours as needed for mild pain.    DOCUSATE SODIUM (COLACE) 100 MG CAPSULE    Take 100 mg by mouth 2 (two) times  daily.   MEMANTINE (NAMENDA) 10 MG TABLET    Take 10 mg by mouth 2 (two) times daily.   MIRTAZAPINE (REMERON) 15 MG TABLET    Take 15 mg by mouth at bedtime.   POLYETHYLENE GLYCOL (MIRALAX / GLYCOLAX) PACKET    Take 17 g by mouth daily.   SENNA-DOCUSATE (SENOKOT-S) 8.6-50 MG PER TABLET    Take 1 tablet by mouth 2 (two) times daily.  Modified Medications   No medications on file  Discontinued Medications   DONEPEZIL (ARICEPT) 10 MG TABLET    Take 5 mg by mouth at bedtime.      SIGNIFICANT DIAGNOSTIC EXAMS  LABS REVIEWED:   03-12-15: wbc 4.2; hgb 10.5; hct 33.9; mcv 85.0; plt 180; glucose 153; bun 14; creat 0.66; k+ 4.0; na++ 137  08-27-15: wbc 7.1 hgb 11.0; hct 37.4; mcv 83.6; plt 189; glucose 116; bun 20.2; creat 0.68; k+ 4.1; na++ 143; liver normal albumin 4.3  10-27-15: wbc 7.1; hgb 11.0; hct 37.4; mcv 83.6; plt 189; glucose 82; bun 14.9; creat 0.62; k+ 4.3; na++ 144; liver normal albumin 3.7  Review of Systems  Unable to perform ROS: Dementia   Physical Exam  Constitutional: No distress.  Frail   Eyes: Conjunctivae are normal.  Neck: Neck supple. No JVD present. No thyromegaly present.  Cardiovascular: Normal rate, regular rhythm and intact distal pulses.   Respiratory: Effort normal and breath sounds normal. No respiratory distress. She has no wheezes.  GI: Soft. Bowel sounds are normal. She exhibits no distension. There is no tenderness.  Musculoskeletal: She exhibits edema.  Able to move all extremities  Has kyphosis Has bilateral trace lower extremity edema   Lymphadenopathy:    She has no cervical adenopathy.  Neurological: She is alert.  Skin: Skin is warm and dry. She is not diaphoretic.  Psychiatric: She has a normal mood and affect.      ASSESSMENT/ PLAN:  1. Vascular dementia; is without change in her status; will continue namenda 10 mg twice daily   Her aricept as her weight is less than 100 pounds.  Weight is is an unfortunate out come in the end stage of  the dementia.  Her weight at this time is 81 pounds.   2. Depression: is presently stable is taking remeron 15 mg nightly will monitor   3. Constipation: will continue miralax daily and senna s twice daily   4. Vit D deficiency: is not on supplement; will monitor  5. Failure to thrive: will continue to monitor her  current weight is 81 pounds; will provide supplements per facility protocol       Synthia Innocenteborah Green NP [facility]  Contact [phone] Monday through Friday 8am- 5pm  After hours call [phone]

## 2016-02-01 ENCOUNTER — Non-Acute Institutional Stay (SKILLED_NURSING_FACILITY): Payer: Medicare Other | Admitting: Adult Health

## 2016-02-01 DIAGNOSIS — R627 Adult failure to thrive: Secondary | ICD-10-CM

## 2016-02-01 DIAGNOSIS — F32A Depression, unspecified: Secondary | ICD-10-CM

## 2016-02-01 DIAGNOSIS — K5901 Slow transit constipation: Secondary | ICD-10-CM

## 2016-02-01 DIAGNOSIS — F329 Major depressive disorder, single episode, unspecified: Secondary | ICD-10-CM | POA: Diagnosis not present

## 2016-02-01 DIAGNOSIS — F0150 Vascular dementia without behavioral disturbance: Secondary | ICD-10-CM | POA: Diagnosis not present

## 2016-02-13 ENCOUNTER — Encounter: Payer: Self-pay | Admitting: Adult Health

## 2016-02-13 NOTE — Progress Notes (Signed)
Location:   [facility]    Place of Service:  SNF (31)   CODE STATUS: dnr  No Known Allergies  Chief Complaint  Patient presents with  . Medical Management of Chronic Issues    HPI:  She is a long term resident of this facility being seen for the management of her chronic illnesses. She is slowly losing weight over time. Her current weight is 76.3 pounds. She is unable to fully participate in the hpi or ros. There are no nursing concerns a this time.    Past Medical History:  Diagnosis Date  . Constipation   . Dementia in Alzheimer's disease   . Kyphosis   . Slow transit constipation 08/24/2015  . Vascular dementia without behavioral disturbance   . Vitamin D deficiency     No past surgical history on file.  Social History   Social History  . Marital status: Single    Spouse name: N/A  . Number of children: N/A  . Years of education: N/A   Occupational History  . Not on file.   Social History Main Topics  . Smoking status: Smoker, Current Status Unknown    Years: 15.00  . Smokeless tobacco: Not on file  . Alcohol use No  . Drug use: No  . Sexual activity: Not on file   Other Topics Concern  . Not on file   Social History Narrative  . No narrative on file   Family History  Problem Relation Age of Onset  . Family history unknown: Yes      VITAL SIGNS BP 121/79   Pulse 100   Temp 98.9 F (37.2 C)   Resp 18   Ht 4\' 6"  (1.372 m)   Wt 76 lb 4.8 oz (34.6 kg)   SpO2 97%   BMI 18.40 kg/m   Patient's Medications  New Prescriptions   No medications on file  Previous Medications   ACETAMINOPHEN (TYLENOL) 325 MG TABLET    Take 650 mg by mouth every 6 (six) hours as needed for mild pain.    DOCUSATE SODIUM (COLACE) 100 MG CAPSULE    Take 100 mg by mouth 2 (two) times daily.   MEMANTINE (NAMENDA) 10 MG TABLET    Take 10 mg by mouth 2 (two) times daily.   MIRTAZAPINE (REMERON) 15 MG TABLET    Take 15 mg by mouth at bedtime.   POLYETHYLENE GLYCOL  (MIRALAX / GLYCOLAX) PACKET    Take 17 g by mouth daily.   SENNA-DOCUSATE (SENOKOT-S) 8.6-50 MG PER TABLET    Take 1 tablet by mouth 2 (two) times daily.  Modified Medications   No medications on file  Discontinued Medications   No medications on file     SIGNIFICANT DIAGNOSTIC EXAMS  ABS REVIEWED:   03-12-15: wbc 4.2; hgb 10.5; hct 33.9; mcv 85.0; plt 180; glucose 153; bun 14; creat 0.66; k+ 4.0; na++ 137  08-27-15: wbc 7.1 hgb 11.0; hct 37.4; mcv 83.6; plt 189; glucose 116; bun 20.2; creat 0.68; k+ 4.1; na++ 143; liver normal albumin 4.3  10-27-15: wbc 7.1; hgb 11.0; hct 37.4; mcv 83.6; plt 189; glucose 82; bun 14.9; creat 0.62; k+ 4.3; na++ 144; liver normal albumin 3.7     Review of Systems  Unable to perform ROS: Dementia   Physical Exam  Constitutional: No distress.  Frail   Eyes: Conjunctivae are normal.  Neck: Neck supple. No JVD present. No thyromegaly present.  Cardiovascular: Normal rate, regular rhythm and intact distal pulses.  Respiratory: Effort normal and breath sounds normal. No respiratory distress. She has no wheezes.  GI: Soft. Bowel sounds are normal. She exhibits no distension. There is no tenderness.  Musculoskeletal: She exhibits edema.  Able to move all extremities  Has kyphosis Has bilateral trace lower extremity edema   Lymphadenopathy:    She has no cervical adenopathy.  Neurological: She is alert.  Skin: Skin is warm and dry. She is not diaphoretic.  Psychiatric: She has a normal mood and affect.      ASSESSMENT/ PLAN:  1. Vascular dementia; is without change in her status; will continue namenda 10 mg twice daily   Her aricept was stopped  as her weight is less than 100 pounds.  Weight is is an unfortunate out come in the end stage of the dementia.  Her weight at this time is 76 pounds.   2. Depression: is presently stable is taking remeron 15 mg nightly will monitor   3. Constipation: will continue miralax daily and senna s twice daily    4. Vit D deficiency: is not on supplement; will monitor  5. Failure to thrive: will continue to monitor her  current weight is 76 pounds; will provide supplements per facility protocol    Synthia Innocenteborah Alison Kubicki NP [facility]  Contact [phone] Monday through Friday 8am- 5pm  After hours call [phone]

## 2016-03-21 ENCOUNTER — Non-Acute Institutional Stay (SKILLED_NURSING_FACILITY): Payer: Medicare Other | Admitting: Adult Health

## 2016-03-21 ENCOUNTER — Encounter: Payer: Self-pay | Admitting: Adult Health

## 2016-03-21 DIAGNOSIS — K5901 Slow transit constipation: Secondary | ICD-10-CM | POA: Diagnosis not present

## 2016-03-21 DIAGNOSIS — R627 Adult failure to thrive: Secondary | ICD-10-CM | POA: Diagnosis not present

## 2016-03-21 DIAGNOSIS — F0150 Vascular dementia without behavioral disturbance: Secondary | ICD-10-CM | POA: Diagnosis not present

## 2016-03-21 DIAGNOSIS — F32A Depression, unspecified: Secondary | ICD-10-CM

## 2016-03-21 DIAGNOSIS — F329 Major depressive disorder, single episode, unspecified: Secondary | ICD-10-CM | POA: Diagnosis not present

## 2016-03-21 NOTE — Progress Notes (Signed)
Location:   [facility] Room Number: 113 B Place of Service:  SNF (31)   CODE STATUS: DNR  No Known Allergies  Chief Complaint  Patient presents with  . Medical Management of Chronic Issues    Routine Visit    HPI:  She is a long term resident of this facility being seen for the management of her chronic illnesses. There is little change in her status. Her weight is stable at 76 pounds. She is unable to participate in the hpi or ros. There are no nursing concerns at this time.    Past Medical History:  Diagnosis Date  . Constipation   . Dementia in Alzheimer's disease   . Kyphosis   . Slow transit constipation 08/24/2015  . Vascular dementia without behavioral disturbance   . Vitamin D deficiency     History reviewed. No pertinent surgical history.  Social History   Social History  . Marital status: Single    Spouse name: N/A  . Number of children: N/A  . Years of education: N/A   Occupational History  . Not on file.   Social History Main Topics  . Smoking status: Smoker, Current Status Unknown    Years: 15.00  . Smokeless tobacco: Not on file  . Alcohol use No  . Drug use: No  . Sexual activity: No   Other Topics Concern  . Not on file   Social History Narrative  . No narrative on file   Family History  Problem Relation Age of Onset  . Family history unknown: Yes      VITAL SIGNS BP 140/65   Pulse 68   Temp (!) 96.9 F (36.1 C)   Resp 18   Ht 4\' 6"  (1.372 m)   Wt 76 lb 8 oz (34.7 kg)   SpO2 99%   BMI 18.44 kg/m   Patient's Medications  New Prescriptions   No medications on file  Previous Medications   ACETAMINOPHEN (TYLENOL) 325 MG TABLET    Take 650 mg by mouth every 6 (six) hours as needed for mild pain.    DOCUSATE SODIUM (COLACE) 100 MG CAPSULE    Take 100 mg by mouth 2 (two) times daily.   MEMANTINE (NAMENDA) 10 MG TABLET    Take 10 mg by mouth 2 (two) times daily.   MIRTAZAPINE (REMERON) 15 MG TABLET    Take 15 mg by  mouth at bedtime.   POLYETHYLENE GLYCOL (MIRALAX / GLYCOLAX) PACKET    Take 17 g by mouth daily.   SENNA-DOCUSATE (SENOKOT-S) 8.6-50 MG PER TABLET    Take 1 tablet by mouth 2 (two) times daily.   UNABLE TO FIND    Med Pass 120 ml PO TID for supplement  Modified Medications   No medications on file  Discontinued Medications   No medications on file     SIGNIFICANT DIAGNOSTIC EXAMS  LABS REVIEWED:   03-12-15: wbc 4.2; hgb 10.5; hct 33.9; mcv 85.0; plt 180; glucose 153; bun 14; creat 0.66; k+ 4.0; na++ 137  08-27-15: wbc 7.1 hgb 11.0; hct 37.4; mcv 83.6; plt 189; glucose 116; bun 20.2; creat 0.68; k+ 4.1; na++ 143; liver normal albumin 4.3  10-27-15: wbc 7.1; hgb 11.0; hct 37.4; mcv 83.6; plt 189; glucose 82; bun 14.9; creat 0.62; k+ 4.3; na++ 144; liver normal albumin 3.7     Review of Systems  Unable to perform ROS: Dementia   Physical Exam  Constitutional: No distress.  Frail   Eyes: Conjunctivae  are normal.  Neck: Neck supple. No JVD present. No thyromegaly present.  Cardiovascular: Normal rate, regular rhythm and intact distal pulses.   Respiratory: Effort normal and breath sounds normal. No respiratory distress. She has no wheezes.  GI: Soft. Bowel sounds are normal. She exhibits no distension. There is no tenderness.  Musculoskeletal: She exhibits edema.  Able to move all extremities  Has kyphosis Has bilateral trace lower extremity edema   Lymphadenopathy:    She has no cervical adenopathy.  Neurological: She is alert.  Skin: Skin is warm and dry. She is not diaphoretic.  Psychiatric: She has a normal mood and affect.      ASSESSMENT/ PLAN:  1. Vascular dementia; is without change in her status; will continue namenda 10 mg twice daily   Her aricept was stopped  as her weight is less than 100 pounds.  Weight is is an unfortunate out come in the end stage of the dementia.  Her weight at this time is 76 pounds.   2. Depression: is presently stable is taking remeron 15  mg nightly will monitor   3. Constipation: will continue miralax daily colace twice daily  and senna s twice daily   4. Vit D deficiency: is not on supplement; will monitor  5. Failure to thrive: will continue to monitor her  current weight is 76 pounds; will provide supplements per facility protocol   Synthia Innocent NP [facility]  Contact [phone] Monday through Friday 8am- 5pm  After hours call [phone]

## 2016-04-02 ENCOUNTER — Emergency Department (HOSPITAL_COMMUNITY): Payer: Medicare Other

## 2016-04-02 ENCOUNTER — Inpatient Hospital Stay (HOSPITAL_COMMUNITY)
Admission: EM | Admit: 2016-04-02 | Discharge: 2016-04-06 | DRG: 177 | Disposition: A | Discharge status: Skilled Nursing Facility | Payer: Medicare Other | Attending: Internal Medicine | Admitting: Internal Medicine

## 2016-04-02 ENCOUNTER — Inpatient Hospital Stay (HOSPITAL_COMMUNITY): Payer: Medicare Other

## 2016-04-02 ENCOUNTER — Encounter (HOSPITAL_COMMUNITY): Payer: Self-pay | Admitting: Emergency Medicine

## 2016-04-02 DIAGNOSIS — M40209 Unspecified kyphosis, site unspecified: Secondary | ICD-10-CM | POA: Diagnosis present

## 2016-04-02 DIAGNOSIS — E860 Dehydration: Secondary | ICD-10-CM | POA: Diagnosis present

## 2016-04-02 DIAGNOSIS — G9341 Metabolic encephalopathy: Secondary | ICD-10-CM | POA: Diagnosis present

## 2016-04-02 DIAGNOSIS — G92 Toxic encephalopathy: Secondary | ICD-10-CM | POA: Diagnosis present

## 2016-04-02 DIAGNOSIS — Z681 Body mass index (BMI) 19 or less, adult: Secondary | ICD-10-CM

## 2016-04-02 DIAGNOSIS — F0150 Vascular dementia without behavioral disturbance: Secondary | ICD-10-CM | POA: Diagnosis present

## 2016-04-02 DIAGNOSIS — Z66 Do not resuscitate: Secondary | ICD-10-CM | POA: Diagnosis present

## 2016-04-02 DIAGNOSIS — G309 Alzheimer's disease, unspecified: Secondary | ICD-10-CM | POA: Diagnosis present

## 2016-04-02 DIAGNOSIS — E870 Hyperosmolality and hypernatremia: Secondary | ICD-10-CM | POA: Diagnosis present

## 2016-04-02 DIAGNOSIS — E43 Unspecified severe protein-calorie malnutrition: Secondary | ICD-10-CM | POA: Diagnosis present

## 2016-04-02 DIAGNOSIS — F329 Major depressive disorder, single episode, unspecified: Secondary | ICD-10-CM | POA: Diagnosis present

## 2016-04-02 DIAGNOSIS — G9340 Encephalopathy, unspecified: Secondary | ICD-10-CM

## 2016-04-02 DIAGNOSIS — Z515 Encounter for palliative care: Secondary | ICD-10-CM | POA: Diagnosis present

## 2016-04-02 DIAGNOSIS — J690 Pneumonitis due to inhalation of food and vomit: Secondary | ICD-10-CM | POA: Diagnosis present

## 2016-04-02 DIAGNOSIS — Z87898 Personal history of other specified conditions: Secondary | ICD-10-CM

## 2016-04-02 DIAGNOSIS — L89892 Pressure ulcer of other site, stage 2: Secondary | ICD-10-CM | POA: Diagnosis present

## 2016-04-02 DIAGNOSIS — R64 Cachexia: Secondary | ICD-10-CM | POA: Diagnosis present

## 2016-04-02 DIAGNOSIS — F32A Depression, unspecified: Secondary | ICD-10-CM | POA: Diagnosis present

## 2016-04-02 DIAGNOSIS — K5901 Slow transit constipation: Secondary | ICD-10-CM | POA: Diagnosis present

## 2016-04-02 DIAGNOSIS — Z79899 Other long term (current) drug therapy: Secondary | ICD-10-CM

## 2016-04-02 DIAGNOSIS — E871 Hypo-osmolality and hyponatremia: Secondary | ICD-10-CM | POA: Diagnosis present

## 2016-04-02 DIAGNOSIS — R627 Adult failure to thrive: Secondary | ICD-10-CM | POA: Diagnosis present

## 2016-04-02 DIAGNOSIS — L89329 Pressure ulcer of left buttock, unspecified stage: Secondary | ICD-10-CM | POA: Diagnosis present

## 2016-04-02 DIAGNOSIS — F0280 Dementia in other diseases classified elsewhere without behavioral disturbance: Secondary | ICD-10-CM | POA: Diagnosis present

## 2016-04-02 DIAGNOSIS — L8990 Pressure ulcer of unspecified site, unspecified stage: Secondary | ICD-10-CM | POA: Insufficient documentation

## 2016-04-02 LAB — URINALYSIS, ROUTINE W REFLEX MICROSCOPIC
Bilirubin Urine: NEGATIVE
GLUCOSE, UA: NEGATIVE mg/dL
Hgb urine dipstick: NEGATIVE
Ketones, ur: 5 mg/dL — AB
Leukocytes, UA: NEGATIVE
Nitrite: NEGATIVE
PH: 5 (ref 5.0–8.0)
Protein, ur: 30 mg/dL — AB
Specific Gravity, Urine: 1.021 (ref 1.005–1.030)

## 2016-04-02 LAB — I-STAT CG4 LACTIC ACID, ED
LACTIC ACID, VENOUS: 1.94 mmol/L — AB (ref 0.5–1.9)
Lactic Acid, Venous: 2.6 mmol/L (ref 0.5–1.9)

## 2016-04-02 LAB — CBC WITH DIFFERENTIAL/PLATELET
Basophils Absolute: 0 10*3/uL (ref 0.0–0.1)
Basophils Relative: 0 %
EOS ABS: 0 10*3/uL (ref 0.0–0.7)
EOS PCT: 0 %
HCT: 35.2 % — ABNORMAL LOW (ref 36.0–46.0)
Hemoglobin: 10.1 g/dL — ABNORMAL LOW (ref 12.0–15.0)
LYMPHS ABS: 0.8 10*3/uL (ref 0.7–4.0)
Lymphocytes Relative: 9 %
MCH: 24.3 pg — AB (ref 26.0–34.0)
MCHC: 28.7 g/dL — AB (ref 30.0–36.0)
MCV: 84.6 fL (ref 78.0–100.0)
MONOS PCT: 4 %
Monocytes Absolute: 0.3 10*3/uL (ref 0.1–1.0)
Neutro Abs: 8.1 10*3/uL — ABNORMAL HIGH (ref 1.7–7.7)
Neutrophils Relative %: 87 %
PLATELETS: 126 10*3/uL — AB (ref 150–400)
RBC: 4.16 MIL/uL (ref 3.87–5.11)
RDW: 18.5 % — ABNORMAL HIGH (ref 11.5–15.5)
WBC: 9.2 10*3/uL (ref 4.0–10.5)

## 2016-04-02 LAB — COMPREHENSIVE METABOLIC PANEL
ALK PHOS: 70 U/L (ref 38–126)
ALT: 16 U/L (ref 14–54)
AST: 22 U/L (ref 15–41)
Albumin: 2.5 g/dL — ABNORMAL LOW (ref 3.5–5.0)
Anion gap: 7 (ref 5–15)
BILIRUBIN TOTAL: 0.5 mg/dL (ref 0.3–1.2)
BUN: 33 mg/dL — AB (ref 6–20)
CALCIUM: 8.6 mg/dL — AB (ref 8.9–10.3)
CHLORIDE: 129 mmol/L — AB (ref 101–111)
CO2: 30 mmol/L (ref 22–32)
CREATININE: 0.9 mg/dL (ref 0.44–1.00)
GFR calc Af Amer: >60 mL/min (ref >60)
GFR, EST NON AFRICAN AMERICAN: 53 mL/min — AB (ref >60)
Glucose, Bld: 148 mg/dL — ABNORMAL HIGH (ref 65–99)
Potassium: 4.2 mmol/L (ref 3.5–5.1)
Sodium: 166 mmol/L (ref 135–145)
Total Protein: 6 g/dL — ABNORMAL LOW (ref 6.5–8.1)

## 2016-04-02 LAB — OSMOLALITY, URINE: Osmolality, Ur: 736 mOsm/kg (ref 300–900)

## 2016-04-02 LAB — PROTIME-INR
INR: 1.09
PROTHROMBIN TIME: 14.2 s (ref 11.4–15.2)

## 2016-04-02 LAB — SODIUM, URINE, RANDOM: SODIUM UR: 32 mmol/L

## 2016-04-02 LAB — CREATININE, URINE, RANDOM: Creatinine, Urine: 104.15 mg/dL

## 2016-04-02 LAB — MRSA PCR SCREENING: MRSA BY PCR: NEGATIVE

## 2016-04-02 LAB — OSMOLALITY: OSMOLALITY: 359 mosm/kg — AB (ref 275–295)

## 2016-04-02 LAB — TSH: TSH: 2.301 u[IU]/mL (ref 0.350–4.500)

## 2016-04-02 LAB — URIC ACID: URIC ACID, SERUM: 6.6 mg/dL (ref 2.3–6.6)

## 2016-04-02 MED ORDER — SODIUM CHLORIDE 0.9 % IV BOLUS (SEPSIS): 1000.0000 mL | Freq: Once | INTRAVENOUS | Status: DC

## 2016-04-02 MED ORDER — MEMANTINE HCL 10 MG PO TABS
10.0000 mg | ORAL_TABLET | Freq: Two times a day (BID) | ORAL | Status: DC
  Filled 2016-04-02 (×6): qty 1

## 2016-04-02 MED ORDER — HEPARIN SODIUM (PORCINE) 5000 UNIT/ML IJ SOLN
5000.0000 [IU] | Freq: Three times a day (TID) | INTRAMUSCULAR | Status: DC
  Administered 2016-04-02 – 2016-04-05 (×7): 5000 [IU] via SUBCUTANEOUS
  Filled 2016-04-02 (×7): qty 1

## 2016-04-02 MED ORDER — HYDROCODONE-ACETAMINOPHEN 5-325 MG PO TABS: 1.0000 | ORAL_TABLET | ORAL | Status: DC | PRN

## 2016-04-02 MED ORDER — ONDANSETRON HCL 4 MG/2ML IJ SOLN
4.0000 mg | Freq: Four times a day (QID) | INTRAMUSCULAR | Status: DC | PRN
Start: 2016-04-02 — End: 2016-04-07

## 2016-04-02 MED ORDER — PIPERACILLIN-TAZOBACTAM 3.375 G IVPB
3.3750 g | Freq: Three times a day (TID) | INTRAVENOUS | Status: DC
Start: 2016-04-03 — End: 2016-04-03
  Administered 2016-04-03 (×2): 3.375 g via INTRAVENOUS
  Filled 2016-04-02 (×2): qty 50

## 2016-04-02 MED ORDER — MORPHINE SULFATE (PF) 4 MG/ML IV SOLN: 2.0000 mg | INTRAVENOUS | Status: DC | PRN

## 2016-04-02 MED ORDER — DEXTROSE 5 % IV SOLN
INTRAVENOUS | Status: DC
  Administered 2016-04-03: 01:00:00 via INTRAVENOUS

## 2016-04-02 MED ORDER — BISACODYL 10 MG RE SUPP
10.0000 mg | Freq: Every day | RECTAL | Status: DC
  Administered 2016-04-02 – 2016-04-05 (×4): 10 mg via RECTAL
  Filled 2016-04-02 (×5): qty 1

## 2016-04-02 MED ORDER — SODIUM CHLORIDE 0.9 % IV BOLUS (SEPSIS)
600.0000 mL | Freq: Once | INTRAVENOUS | Status: AC
  Administered 2016-04-02: 600 mL via INTRAVENOUS

## 2016-04-02 MED ORDER — PIPERACILLIN-TAZOBACTAM 3.375 G IVPB 30 MIN
3.3750 g | Freq: Once | INTRAVENOUS | Status: AC
  Administered 2016-04-02: 3.375 g via INTRAVENOUS
  Filled 2016-04-02: qty 50

## 2016-04-02 MED ORDER — ALBUTEROL SULFATE (2.5 MG/3ML) 0.083% IN NEBU: 2.5000 mg | INHALATION_SOLUTION | RESPIRATORY_TRACT | Status: DC | PRN

## 2016-04-02 NOTE — ED Provider Notes (Signed)
[facility]-EMERGENCY DEPT Provider Note   CSN: [id number] Arrival date & time: 04/02/16  1455     History   Chief Complaint Chief Complaint  Patient presents with  . Altered Mental Status    HPI Abby Tucholski is a 83 y.o. female.  The history is provided by the nursing home and the EMS personnel. History limited by: demetnia, non verbal.  Altered Mental Status   This is a chronic problem. The current episode started more than 1 week ago. The problem has been gradually worsening. Associated symptoms include confusion, somnolence, unresponsiveness and weakness. Her past medical history is significant for dementia.    Pt non verbal 75 pound female unable to participate ni ADLs at baseline, here with "worsening".  Eating less. More confused per EMS. Per EMS febril to 103. Normal temp here.   Past Medical History:  Diagnosis Date  . Constipation   . Dementia in Alzheimer's disease   . Kyphosis   . Slow transit constipation 08/24/2015  . Vascular dementia without behavioral disturbance   . Vitamin D deficiency     Patient Active Problem List   Diagnosis Date Noted  . Failure to thrive in adult 08/24/2015  . Slow transit constipation 08/24/2015  . Vitamin D deficiency 08/24/2015  . Vascular dementia without behavioral disturbance 08/24/2015  . Depression 08/24/2015    History reviewed. No pertinent surgical history.  OB History    No data available       Home Medications    Prior to Admission medications   Medication Sig Start Date End Date Taking? Authorizing Provider  acetaminophen (TYLENOL) 325 MG tablet Take 650 mg by mouth every 6 (six) hours as needed.   Yes Historical Provider, MD  docusate sodium (COLACE) 100 MG capsule Take 100 mg by mouth 2 (two) times daily.   Yes Historical Provider, MD  memantine (NAMENDA) 10 MG tablet Take 10 mg by mouth 2 (two) times daily.   Yes Historical Provider, MD  mirtazapine (REMERON) 15 MG tablet Take 15 mg by mouth at bedtime.    Yes Historical Provider, MD  polyethylene glycol (MIRALAX / GLYCOLAX) packet Take 17 g by mouth daily.   Yes Historical Provider, MD  senna (SENOKOT) 8.6 MG TABS tablet Take 1 tablet by mouth 2 (two) times daily.   Yes Historical Provider, MD  senna-docusate (SENOKOT-S) 8.6-50 MG per tablet Take 1 tablet by mouth 2 (two) times daily.   Yes Historical Provider, MD  UNABLE TO FIND Med Pass 120 ml PO TID for supplement   Yes Historical Provider, MD  acetaminophen (TYLENOL) 325 MG tablet Take 650 mg by mouth every 6 (six) hours as needed for mild pain.     Historical Provider, MD    Family History Family History  Problem Relation Age of Onset  . Family history unknown: Yes    Social History Social History  Substance Use Topics  . Smoking status: Smoker, Current Status Unknown    Years: 15.00  . Smokeless tobacco: Not on file  . Alcohol use No     Allergies   Patient has no known allergies.   Review of Systems Review of Systems  Unable to perform ROS: Dementia  Neurological: Positive for weakness.  Psychiatric/Behavioral: Positive for confusion.     Physical Exam Updated Vital Signs BP 112/67   Pulse 96   Temp 99.6 F (37.6 C) (Rectal)   Resp 25   Ht 4\' 6"  (1.372 m)   Wt 72 lb (32.7 kg)  SpO2 99%   BMI 17.36 kg/m   Physical Exam  Constitutional:  Kyphotic elderly cachectic female curled on her side, spastic limbs, non verbal  HENT:  Head: Normocephalic.  Mouth/Throat: No oropharyngeal exudate.  Edentulous.  Eyes:  Eyes closed  Cardiovascular: Normal rate.   Murmur heard. Pulmonary/Chest: Effort normal and breath sounds normal. No respiratory distress.  Abdominal: Soft. Bowel sounds are normal. She exhibits no distension. There is no tenderness.  Musculoskeletal:  Not voluntarily moving limbs, spasticity  Neurological:  oreinted X 0. Non verbal  Skin: Skin is warm and dry. No erythema. No pallor.  Ulcer to L hip, covered, mildly eroded with mild  drainage.      ED Treatments / Results  Labs (all labs ordered are listed, but only abnormal results are displayed) Labs Reviewed  COMPREHENSIVE METABOLIC PANEL - Abnormal; Notable for the following:       Result Value   Sodium 166 (*)    Chloride 129 (*)    Glucose, Bld 148 (*)    BUN 33 (*)    Calcium 8.6 (*)    Total Protein 6.0 (*)    Albumin 2.5 (*)    GFR calc non Af Amer 53 (*)    All other components within normal limits  CBC WITH DIFFERENTIAL/PLATELET - Abnormal; Notable for the following:    Hemoglobin 10.1 (*)    HCT 35.2 (*)    MCH 24.3 (*)    MCHC 28.7 (*)    RDW 18.5 (*)    Platelets 126 (*)    Neutro Abs 8.1 (*)    All other components within normal limits  URINALYSIS, ROUTINE W REFLEX MICROSCOPIC - Abnormal; Notable for the following:    APPearance CLOUDY (*)    Ketones, ur 5 (*)    Protein, ur 30 (*)    Bacteria, UA RARE (*)    Squamous Epithelial / LPF 0-5 (*)    All other components within normal limits  I-STAT CG4 LACTIC ACID, ED - Abnormal; Notable for the following:    Lactic Acid, Venous 1.94 (*)    All other components within normal limits  CULTURE, BLOOD (ROUTINE X 2)  CULTURE, BLOOD (ROUTINE X 2)    EKG  EKG Interpretation  Date/Time:  Sunday April 02 2016 15:50:04 EDT Ventricular Rate:  94 PR Interval:  118 QRS Duration: 64 QT Interval:  352 QTC Calculation: 440 R Axis:   10 Text Interpretation:  Normal sinus rhythm Normal ECG Normal sinus rhythm No significant change since last tracing Confirmed by Kandis MannanMACKUEN, COURTNEY ([id number]) on 04/02/2016 4:15:28 PM       Radiology Dg Chest 2 View  Result Date: 04/02/2016 CLINICAL DATA:  Altered mental status.  Fever. EXAM: CHEST  2 VIEW COMPARISON:  Two-view chest x-ray 03/12/2015. FINDINGS: The heart is mildly enlarged. Atherosclerotic calcifications are present at the aorta. Right lower and upper lobe pneumonia on a. Left lung is clear. Chronic interstitial coarsening is present. A nodular  density in the left upper lobe is stable. Exaggerated kyphosis is present in the upper thoracic spine. IMPRESSION: 1. Right lower and probable upper lobe pneumonia is new. Electronically Signed   By: Marin Robertshristopher  Mattern M.D.   On: 04/02/2016 16:39    Procedures Procedures (including critical care time)  Medications Ordered in ED Medications  piperacillin-tazobactam (ZOSYN) IVPB 3.375 g (3.375 g Intravenous New Bag/Given 04/02/16 1739)  piperacillin-tazobactam (ZOSYN) IVPB 3.375 g (not administered)  sodium chloride 0.9 % bolus 600 mL (600 mLs Intravenous  New Bag/Given 04/02/16 1739)     Initial Impression / Assessment and Plan / ED Course  I have reviewed the triage vital signs and the nursing notes.  Pertinent labs & imaging results that were available during my care of the patient were reviewed by me and considered in my medical decision making (see chart for details).    Pt frail cachectic non verbal female presentign with worsening AMS. Will rule out infection in lungs, UA nad blood cultures given reported fever. Pt has sacral wound on left hip, could be culprit.  5:42 PM Hypernatremic. Will give 20cc/kg bolus. Think this is likely from not eating/drinking.  Called patient's listed family member and she states that she would like patient to be admitted, but we should talk to [place], a grandson? [phone]  Will gently fluid resusictate but ultimately patient needs palliative care.Marland Kitchen  CRITICAL CARE Performed by: Arlana Hove Total critical care time: Critical care time was exclusive of separately billable procedures and treating other patients. Critical care was necessary to treat or prevent imminent or life-threatening deterioration. Critical care was time spent personally by me on the following activities: development of treatment plan with patient and/or surrogate as well as nursing, discussions with consultants, evaluation of patient's response to treatment,  examination of patient, obtaining history from patient or surrogate, ordering and performing treatments and interventions, ordering and review of laboratory studies, ordering and review of radiographic studies, pulse oximetry and re-evaluation of patient's condition.    Final Clinical Impressions(s) / ED Diagnoses   Final diagnoses:  None    New Prescriptions New Prescriptions   No medications on file     Malayiah Mcbrayer Randall An, MD 04/02/16 1742

## 2016-04-02 NOTE — Progress Notes (Signed)
Pt to the unit. Pt is stable per baseline. Oriented to room, staff, and call bell. Patient open eyes and stated "yes" to being asked if she was doing ok. Bed in lowest position, call bell within reach- will continue to monitor.

## 2016-04-02 NOTE — H&P (Signed)
[facility] H&P   Patient Demographics:    Mandy Welch, is a 83 y.o. female  MRN: [id number]   DOB - 08/29/1918  Admit Date - 04/02/2016  Outpatient Primary MD for the patient is Martha ClanShaw, William, MD      Patient coming from: SNF  Chief Complaint  Patient presents with  . Altered Mental Status      HPI:    Mandy Welch  is a 83 y.o. female, History of vascular dementia, severe protein calorie malnutrition with cachexia, SNF resident, anxiety and depression, who was brought in from nursing home due to decreased mental status, per entry as per chart review patient at baseline is verbal and able to communicate minimally, she was found to be nonverbal and minimally responsive at nursing home and transferred to the ER. In the ER she was found to have right-sided infiltrate suggestive of pneumonia, she appeared clinically dehydrated with severe hypernatremia. I was called to admit the patient.    Review of systems:    Patient is obtunded and unable to answer questions or follow commands.   With Past History of the following :    Past Medical History:  Diagnosis Date  . Constipation   . Dementia in Alzheimer's disease   . Kyphosis   . Slow transit constipation 08/24/2015  . Vascular dementia without behavioral disturbance   . Vitamin D deficiency       History reviewed. No pertinent surgical history.    Social History:     Social History  Substance Use Topics  . Smoking status: Smoker, Current Status Unknown    Years: 15.00  . Smokeless tobacco: Not on file  . Alcohol use No         Family History :     Family History  Problem Relation Age of Onset  . Family history unknown: Yes       Home Medications:   Prior to Admission medications   Medication Sig Start Date End Date Taking? Authorizing Provider  acetaminophen (TYLENOL) 325 MG tablet Take 650 mg by mouth every 6 (six) hours as needed.   Yes Historical Provider, MD  docusate sodium (COLACE) 100 MG capsule Take 100 mg by mouth 2 (two) times daily.   Yes Historical Provider, MD  memantine (NAMENDA) 10 MG tablet Take 10 mg by mouth 2 (two)  times daily.   Yes Historical Provider, MD  mirtazapine (REMERON) 15 MG tablet Take 15 mg by mouth at bedtime.   Yes Historical Provider, MD  polyethylene glycol (MIRALAX / GLYCOLAX) packet Take 17 g by mouth daily.   Yes Historical Provider, MD  senna (SENOKOT) 8.6 MG TABS tablet Take 1 tablet by mouth 2 (two) times daily.   Yes Historical Provider, MD  senna-docusate (SENOKOT-S) 8.6-50 MG per tablet Take 1 tablet by mouth 2 (two) times daily.   Yes Historical Provider, MD  UNABLE TO FIND Med Pass 120 ml PO TID for supplement   Yes Historical Provider, MD  acetaminophen (TYLENOL) 325 MG tablet Take 650 mg by mouth every 6 (six) hours as needed for mild pain.     Historical Provider, MD     Allergies:    No Known Allergies   Physical Exam:   Vitals  Blood pressure 112/67, pulse 96, temperature 99.6 F (37.6 C), temperature source Rectal, resp. rate 25, height 4\' 6"  (1.372 m), weight 32.7 kg (72 lb), SpO2 99 %.   1. General Severely malnourished and cachectic African-American elderly woman lying in bed obtunded, unable to answer questions or follow commands,  2. Appears to be in no distress,.  3. Moves all 4 extremities to painful stimuli, Plantars down going.  4. Ears and Eyes appear Normal, Conjunctivae clear, PERRLA. Moist Oral Mucosa.  5. Supple Neck, No JVD, No cervical lymphadenopathy appriciated, No Carotid Bruits.  6. Symmetrical Chest wall movement, Good air movement bilaterally, CTAB.  7. RRR, No Gallops, Rubs or Murmurs, No Parasternal Heave.  8. Positive Bowel  Sounds, Abdomen Soft, No tenderness, No organomegaly appriciated,No rebound -guarding or rigidity.  9.  No Cyanosis, Normal Skin Turgor, No Skin Rash or Bruise.  10. Good muscle tone,  joints appear normal , no effusions, Normal ROM.  11. No Palpable Lymph Nodes in Neck or Axillae      Data Review:    CBC  Recent Labs Lab 04/02/16 1555  WBC 9.2  HGB 10.1*  HCT 35.2*  PLT 126*  MCV 84.6  MCH 24.3*  MCHC 28.7*  RDW 18.5*  LYMPHSABS 0.8  MONOABS 0.3  EOSABS 0.0  BASOSABS 0.0   ------------------------------------------------------------------------------------------------------------------  Chemistries   Recent Labs Lab 04/02/16 1555  NA 166*  K 4.2  CL 129*  CO2 30  GLUCOSE 148*  BUN 33*  CREATININE 0.90  CALCIUM 8.6*  AST 22  ALT 16  ALKPHOS 70  BILITOT 0.5   ------------------------------------------------------------------------------------------------------------------ estimated creatinine clearance is 18.7 mL/min (by C-G formula based on SCr of 0.9 mg/dL). ------------------------------------------------------------------------------------------------------------------ No results for input(s): TSH, T4TOTAL, T3FREE, THYROIDAB in the last 72 hours.  Invalid input(s): FREET3  Coagulation profile No results for input(s): INR, PROTIME in the last 168 hours. ------------------------------------------------------------------------------------------------------------------- No results for input(s): DDIMER in the last 72 hours. -------------------------------------------------------------------------------------------------------------------  Cardiac Enzymes No results for input(s): CKMB, TROPONINI, MYOGLOBIN in the last 168 hours.  Invalid input(s): CK ------------------------------------------------------------------------------------------------------------------ No results found for:  BNP   ---------------------------------------------------------------------------------------------------------------  Urinalysis    Component Value Date/Time   COLORURINE YELLOW 04/02/2016 1612   APPEARANCEUR CLOUDY (A) 04/02/2016 1612   LABSPEC 1.021 04/02/2016 1612   PHURINE 5.0 04/02/2016 1612   GLUCOSEU NEGATIVE 04/02/2016 1612   HGBUR NEGATIVE 04/02/2016 1612   BILIRUBINUR NEGATIVE 04/02/2016 1612   KETONESUR 5 (A) 04/02/2016 1612   PROTEINUR 30 (A) 04/02/2016 1612   UROBILINOGEN 0.2 05/30/2013 1852   NITRITE NEGATIVE 04/02/2016 1612   LEUKOCYTESUR NEGATIVE  04/02/2016 1612    ----------------------------------------------------------------------------------------------------------------   Imaging Results:    Dg Chest 2 View  Result Date: 04/02/2016 CLINICAL DATA:  Altered mental status.  Fever. EXAM: CHEST  2 VIEW COMPARISON:  Two-view chest x-ray 03/12/2015. FINDINGS: The heart is mildly enlarged. Atherosclerotic calcifications are present at the aorta. Right lower and upper lobe pneumonia on a. Left lung is clear. Chronic interstitial coarsening is present. A nodular density in the left upper lobe is stable. Exaggerated kyphosis is present in the upper thoracic spine. IMPRESSION: 1. Right lower and probable upper lobe pneumonia is new. Electronically Signed   By: Marin Roberts M.D.   On: 04/02/2016 16:39    My personal review of EKG: Rhythm NSR, Rate  94 /min,  no Acute ST changes   Assessment & Plan:      1. Toxic and metabolic encephalopathy. This is due to combination of pneumonia and severe hypernatremia.  A. Aspiration pneumonia at SNF. Nothing by mouth except medications, blood cultures, continue Zosyn, speech to evaluate.  B. Severe hypernatremia. D5W and monitor. Check serum and urine osmolality along with urine sodium. Monitor BMP.  2. Advanced vascular dementia, severe cachexia, severe protein calorie malnutrition, failure to thrive. At this time  supportive care. Will involve palliative care for goals of care and future line of treatment, at this time will do simple medical treatmentwill be attempted. She is DO NOT RESUSCITATE. Attempted contacting grandson Cedric on his listed phone number however no response, message left.  Home medications for now are held or minimized due to her encephalopathy, will reevaluate home medications once mental status has improved.      DVT Prophylaxis Heparin  AM Labs Ordered, also please review Full Orders  Family Communication: Admission, patients condition and plan of care including tests being ordered have been discussed with the patient who indicates ??? understanding and agree with the plan and Code Status.  Code Status DNR  Likely DC to  SNF  Condition GUARDED    Consults called: Pall Care    Admission status: Inpt    Time spent in minutes : 30   SINGH,PRASHANT K M.D on 04/02/2016 at 6:00 PM  Between 7am to 7pm - Pager - [phone]. After 7pm go to [email or web address] - password [facility]  [facility] - Office  [phone]

## 2016-04-02 NOTE — ED Notes (Addendum)
166 sodium level critical lab notification. Md notified. No new orders

## 2016-04-02 NOTE — ED Triage Notes (Signed)
Per [facility], pt from [facility], staff called out due to pt mental status declining. Pt has dementia normal baseline alert to verbal, pt will not speak per baseline. Pt now only responsive to pain, will only open eyes for a few seconds. Temp 103.1. RR 40, HR 100. 85/58. 115/73 given 200 cc fluids by ems.

## 2016-04-02 NOTE — ED Notes (Signed)
Serum osmoloity 359. MD notified.

## 2016-04-02 NOTE — Progress Notes (Signed)
Pharmacy Antibiotic Note  Mandy Welch is a 83 y.o. female admitted on 04/02/2016 with pneumonia.  Pharmacy has been consulted for zosyn dosing.  Plan: Zosyn 3.375g IV q8h (4 hour infusion).  Monitor culture data, renal function and clinical course  Height: 4\' 6"  (137.2 cm) Weight: 72 lb (32.7 kg) IBW/kg (Calculated) : 31.7  Temp (24hrs), Avg:99.6 F (37.6 C), Min:99.6 F (37.6 C), Max:99.6 F (37.6 C)   Recent Labs Lab 04/02/16 1555 04/02/16 1601  WBC 9.2  --   CREATININE 0.90  --   LATICACIDVEN  --  1.94*    Estimated Creatinine Clearance: 18.7 mL/min (by C-G formula based on SCr of 0.9 mg/dL).    No Known Allergies   Arlean Hoppingorey M. Newman PiesBall, PharmD, BCPS Clinical Pharmacist [phone] 04/02/2016 5:26 PM

## 2016-04-02 NOTE — ED Notes (Signed)
Pt has large pressure ulcer on L buttocks with dressing in place.

## 2016-04-03 LAB — BASIC METABOLIC PANEL
Anion gap: 7 (ref 5–15)
BUN: 26 mg/dL — AB (ref 6–20)
CALCIUM: 8 mg/dL — AB (ref 8.9–10.3)
CHLORIDE: 125 mmol/L — AB (ref 101–111)
CO2: 28 mmol/L (ref 22–32)
CREATININE: 0.73 mg/dL (ref 0.44–1.00)
GFR calc non Af Amer: >60 mL/min (ref >60)
Glucose, Bld: 183 mg/dL — ABNORMAL HIGH (ref 65–99)
Potassium: 3.2 mmol/L — ABNORMAL LOW (ref 3.5–5.1)
SODIUM: 160 mmol/L — AB (ref 135–145)

## 2016-04-03 LAB — CBC
HCT: 29.8 % — ABNORMAL LOW (ref 36.0–46.0)
Hemoglobin: 8.7 g/dL — ABNORMAL LOW (ref 12.0–15.0)
MCH: 24.6 pg — AB (ref 26.0–34.0)
MCHC: 29.2 g/dL — AB (ref 30.0–36.0)
MCV: 84.2 fL (ref 78.0–100.0)
PLATELETS: 102 10*3/uL — AB (ref 150–400)
RBC: 3.54 MIL/uL — AB (ref 3.87–5.11)
RDW: 18.7 % — AB (ref 11.5–15.5)
WBC: 8.7 10*3/uL (ref 4.0–10.5)

## 2016-04-03 MED ORDER — DEXTROSE 5 % IV SOLN
INTRAVENOUS | Status: AC
  Administered 2016-04-03 – 2016-04-04 (×2): via INTRAVENOUS

## 2016-04-03 MED ORDER — MAGNESIUM SULFATE IN D5W 1-5 GM/100ML-% IV SOLN
1.0000 g | Freq: Once | INTRAVENOUS | Status: AC
  Administered 2016-04-03: 1 g via INTRAVENOUS
  Filled 2016-04-03: qty 100

## 2016-04-03 MED ORDER — SODIUM CHLORIDE 0.9 % IV SOLN
30.0000 meq | Freq: Two times a day (BID) | INTRAVENOUS | Status: AC
  Administered 2016-04-03 (×2): 30 meq via INTRAVENOUS
  Filled 2016-04-03 (×2): qty 15

## 2016-04-03 MED ORDER — PIPERACILLIN-TAZOBACTAM IN DEX 2-0.25 GM/50ML IV SOLN
2.2500 g | Freq: Three times a day (TID) | INTRAVENOUS | Status: DC
  Administered 2016-04-03 – 2016-04-04 (×4): 2.25 g via INTRAVENOUS
  Filled 2016-04-03 (×6): qty 50

## 2016-04-03 NOTE — Care Management Note (Signed)
Case Management Note  Patient Details  Name: Lillette BoxerGracie Sadler MRN: [id number] Date of Birth: 04/27/1918  Subjective/Objective:                    Action/Plan:  From [facility], aspiration PNA Na 166, plan palliative consult  Expected Discharge Date:                  Expected Discharge Plan:  Skilled Nursing Facility  In-House Referral:  Clinical Social Work  Discharge planning Services     Post Acute Care Choice:    Choice offered to:     DME Arranged:    DME Agency:     HH Arranged:    HH Agency:     Status of Service:  In process, will continue to follow  If discussed at Long Length of Stay Meetings, dates discussed:    Additional Comments:  Kingsley PlanWile, Tylerjames Hoglund Marie, RN 04/03/2016, 10:48 AM

## 2016-04-03 NOTE — Evaluation (Signed)
Clinical/Bedside Swallow Evaluation Patient Details  Name: Mandy Welch MRN: [id number] Date of Birth: 04/17/1918  Today's Date: 04/03/2016 Time: SLP Start Time (ACUTE ONLY): 1057 SLP Stop Time (ACUTE ONLY): 1105 SLP Time Calculation (min) (ACUTE ONLY): 8 min  Past Medical History:  Past Medical History:  Diagnosis Date  . Constipation   . Dementia in Alzheimer's disease   . Kyphosis   . Slow transit constipation 08/24/2015  . Vascular dementia without behavioral disturbance   . Vitamin D deficiency    Past Surgical History: History reviewed. No pertinent surgical history. HPI:  83 year old female with h/o vascular dementia (at baseline, able to communicate verbally, minimally), found to have right sided infiltrate suggestive of PNA, clinical dehydration with severe hypernatremia.    Assessment / Plan / Recommendation Clinical Impression  Mentation does not allow for safe nor functional po intake at this time. Despite max cues, patient unable to maintain alert state with adequate sustained attention for pos. Po trials presented resulted in oral holding and anterior labial spillage of entire bolus. Will f/u for improvement.  SLP Visit Diagnosis: Dysphagia, unspecified (R13.10)    Aspiration Risk       Diet Recommendation NPO   Medication Administration: Via alternative means    Other  Recommendations Oral Care Recommendations: Oral care QID   Follow up Recommendations  (TBD)      Frequency and Duration min 2x/week  2 weeks       Prognosis Prognosis for Safe Diet Advancement: Fair Barriers to Reach Goals: Cognitive deficits      Swallow Study   General HPI: 83 year old female with h/o vascular dementia (at baseline, able to communicate verbally, minimally), found to have right sided infiltrate suggestive of PNA, clinical dehydration with severe hypernatremia.  Type of Study: Bedside Swallow Evaluation Previous Swallow Assessment: none noted Diet Prior to this  Study: NPO Temperature Spikes Noted: No Respiratory Status: Room air History of Recent Intubation: No Behavior/Cognition: Lethargic/Drowsy;Requires cueing;Doesn't follow directions Oral Cavity Assessment: Excessive secretions Oral Cavity - Dentition:  (unable to fully assess) Self-Feeding Abilities: Total assist Patient Positioning: Upright in bed Baseline Vocal Quality: Normal Volitional Cough: Cognitively unable to elicit Volitional Swallow: Unable to elicit    Oral/Motor/Sensory Function Overall Oral Motor/Sensory Function: Other (comment) (unable to assess due to mentation)   Ice Chips Ice chips: Impaired Presentation: Spoon Oral Phase Impairments: Reduced labial seal;Reduced lingual movement/coordination;Impaired mastication;Poor awareness of bolus Oral Phase Functional Implications: Right anterior spillage;Left anterior spillage   Thin Liquid Thin Liquid: Not tested    Nectar Thick Nectar Thick Liquid: Not tested   Honey Thick Honey Thick Liquid: Not tested   Puree Puree: Not tested   Solid   Mandy Welch, Mandy Welch [phone]  Solid: Not tested        Mandy Welch 04/03/2016,12:29 PM

## 2016-04-03 NOTE — Progress Notes (Signed)
Pharmacy Antibiotic Note  Mandy Welch is a 83 y.o. female admitted on 04/02/2016 with pneumonia.  Pharmacy has been consulted for Zosyn dosing.  Patient's renal function is stable.  She is afebrile and her WBC is WNL.   Plan: Change Zosyn to 2.25gm IV Q8H given elderly, low body weight/CrCL Pharmacy will sign off as dosage adjustment is likely unnecessary.  Thank you for the consult!   Height: 4\' 6"  (137.2 cm) Weight: 65 lb 11.2 oz (29.8 kg) IBW/kg (Calculated) : 31.7  Temp (24hrs), Avg:99.3 F (37.4 C), Min:98.2 F (36.8 C), Max:99.8 F (37.7 C)   Recent Labs Lab 04/02/16 1555 04/02/16 1601 04/02/16 1839 04/03/16 0314  WBC 9.2  --   --  8.7  CREATININE 0.90  --   --  0.73  LATICACIDVEN  --  1.94* 2.60*  --     Estimated Creatinine Clearance: 19.8 mL/min (by C-G formula based on SCr of 0.73 mg/dL).    No Known Allergies   Zosyn 3/11 >>  3/11 MRSA PCR - negative 3/11 BCx x2 -     Lionell Matuszak D. Laney Potashang, PharmD, BCPS Pager:  [phone] 04/03/2016, 9:24 AM

## 2016-04-03 NOTE — Progress Notes (Addendum)
PROGRESS NOTE                                                                                                                                                                                                             Patient Demographics:    Mandy Welch, is a 83 y.o. female, DOB - 1918/04/22, ZOX:[id number]  Admit date - 04/02/2016   Admitting Physician Leroy Sea, MD  Outpatient Primary MD for the patient is Martha Clan, MD  LOS - 1  Chief Complaint  Patient presents with  . Altered Mental Status       Brief Narrative   Mandy Welch  is a 83 y.o. female, History of vascular dementia, severe protein calorie malnutrition with cachexia, SNF resident, anxiety and depression, who was brought in from nursing home due to decreased mental status, per entry as per chart review patient at baseline is verbal and able to communicate minimally, she was found to be nonverbal and minimally responsive at nursing home and transferred to the ER. In the ER she was found to have right-sided infiltrate suggestive of pneumonia, she appeared clinically dehydrated with severe hypernatremia.    Subjective:    Mandy Welch today Remains in bed appears in no distress but will not answer my questions or follow commands   Assessment  & Plan :    1. Toxic and metabolic encephalopathy. This is due to combination of Asp.PNA and severe hypernatremia.  A. Aspiration pneumonia at SNF. Nothing by mouth except medications, follow blood cultures, continue Zosyn, speech to evaluate.  B. Severe hypernatremia. Due to severe dehydration, continue IV fluids which are D5W and monitor BMP, hypernatremia improving.  2. Advanced vascular dementia, severe cachexia, severe protein calorie malnutrition, failure to thrive. At this time supportive care. Will involve palliative care for goals of care and future line of treatment, at this  time will do simple medical treatmentwill be attempted. She is DO NOT RESUSCITATE. Attempted contacting grandson Sharyl Nimrod on his listed phone number on 04/03/2026 and left message.  3. Pt has large pressure ulcer on L buttocks with dressing in place from SNF - see W care notes for details, W Care consulted   Home medications for now are held or minimized due to her encephalopathy, will reevaluate home medications once mental status has improved.    Diet : Diet  NPO time specified Except for: Sips with Meds    Family Communication  :  Left message for grandson on the listed cell phone on 04/02/2016  Code Status :  DNR  Disposition Plan  :  TBD  Consults  :  Pall Care  Procedures  :  CT head - non acute  DVT Prophylaxis  :  Heparin   Lab Results  Component Value Date   PLT 102 (L) 04/03/2016    Inpatient Medications  Scheduled Meds: . bisacodyl  10 mg Rectal Daily  . heparin  5,000 Units Subcutaneous Q8H  . memantine  10 mg Oral BID  . piperacillin-tazobactam (ZOSYN)  IV  3.375 g Intravenous Q8H  . potassium chloride (KCL MULTIRUN) 30 mEq in 265 mL IVPB  30 mEq Intravenous BID   Continuous Infusions: . dextrose 125 mL/hr at 04/03/16 0745   PRN Meds:.albuterol, HYDROcodone-acetaminophen, morphine injection, ondansetron (ZOFRAN) IV  Antibiotics  :    Anti-infectives    Start     Dose/Rate Route Frequency Ordered Stop   04/03/16 0100  piperacillin-tazobactam (ZOSYN) IVPB 3.375 g     3.375 g 12.5 mL/hr over 240 Minutes Intravenous Every 8 hours 04/02/16 1728     04/02/16 1800  piperacillin-tazobactam (ZOSYN) IVPB 3.375 g     3.375 g 100 mL/hr over 30 Minutes Intravenous  Once 04/02/16 1728 04/02/16 1811         Objective:   Vitals:   04/02/16 1945 04/02/16 2025 04/03/16 0434 04/03/16 0443  BP: (!) 101/53 (!) 96/57  90/60  Pulse: 87 91  78  Resp: 14 15  16   Temp:  98.2 F (36.8 C)  99.3 F (37.4 C)  TempSrc:  Oral  Oral  SpO2: 98% 99%  100%  Weight:   29.8  kg (65 lb 11.2 oz)   Height:        Wt Readings from Last 3 Encounters:  04/03/16 29.8 kg (65 lb 11.2 oz)  03/21/16 34.7 kg (76 lb 8 oz)  02/01/16 34.6 kg (76 lb 4.8 oz)     Intake/Output Summary (Last 24 hours) at 04/03/16 0920 Last data filed at 04/03/16 0133  Gross per 24 hour  Intake              650 ml  Output                0 ml  Net              650 ml     Physical Exam  Weak, extremely debilitated and cachectic elderly African-American woman lying in hospital bed curled up , will not answer my questions [place].AT,PERRAL Supple Neck,No JVD, No cervical lymphadenopathy appriciated.  Symmetrical Chest wall movement, Good air movement bilaterally, CTAB RRR,No Gallops,Rubs or new Murmurs, No Parasternal Heave +ve B.Sounds, Abd Soft, No tenderness, No organomegaly appriciated, No rebound - guarding or rigidity. No Cyanosis, Clubbing or edema, No new Rash or bruise      Data Review:    CBC  Recent Labs Lab 04/02/16 1555 04/03/16 0314  WBC 9.2 8.7  HGB 10.1* 8.7*  HCT 35.2* 29.8*  PLT 126* 102*  MCV 84.6 84.2  MCH 24.3* 24.6*  MCHC 28.7* 29.2*  RDW 18.5* 18.7*  LYMPHSABS 0.8  --   MONOABS 0.3  --   EOSABS 0.0  --   BASOSABS 0.0  --     Chemistries   Recent Labs Lab 04/02/16 1555 04/03/16 0314  NA 166* 160*  K 4.2 3.2*  CL 129* 125*  CO2 30 28  GLUCOSE 148* 183*  BUN 33* 26*  CREATININE 0.90 0.73  CALCIUM 8.6* 8.0*  AST 22  --   ALT 16  --   ALKPHOS 70  --   BILITOT 0.5  --    ------------------------------------------------------------------------------------------------------------------ No results for input(s): CHOL, HDL, LDLCALC, TRIG, CHOLHDL, LDLDIRECT in the last 72 hours.  No results found for: HGBA1C ------------------------------------------------------------------------------------------------------------------  Recent Labs  04/02/16 2110  TSH 2.301    ------------------------------------------------------------------------------------------------------------------ No results for input(s): VITAMINB12, FOLATE, FERRITIN, TIBC, IRON, RETICCTPCT in the last 72 hours.  Coagulation profile  Recent Labs Lab 04/02/16 2110  INR 1.09    No results for input(s): DDIMER in the last 72 hours.  Cardiac Enzymes No results for input(s): CKMB, TROPONINI, MYOGLOBIN in the last 168 hours.  Invalid input(s): CK ------------------------------------------------------------------------------------------------------------------ No results found for: BNP  Micro Results Recent Results (from the past 240 hour(s))  MRSA PCR Screening     Status: None   Collection Time: 04/02/16  9:03 PM  Result Value Ref Range Status   MRSA by PCR NEGATIVE NEGATIVE Final    Comment:        The GeneXpert MRSA Assay (FDA approved for NASAL specimens only), is one component of a comprehensive MRSA colonization surveillance program. It is not intended to diagnose MRSA infection nor to guide or monitor treatment for MRSA infections.     Radiology Reports Dg Chest 2 View  Result Date: 04/02/2016 CLINICAL DATA:  Altered mental status.  Fever. EXAM: CHEST  2 VIEW COMPARISON:  Two-view chest x-ray 03/12/2015. FINDINGS: The heart is mildly enlarged. Atherosclerotic calcifications are present at the aorta. Right lower and upper lobe pneumonia on a. Left lung is clear. Chronic interstitial coarsening is present. A nodular density in the left upper lobe is stable. Exaggerated kyphosis is present in the upper thoracic spine. IMPRESSION: 1. Right lower and probable upper lobe pneumonia is new. Electronically Signed   By: Marin Roberts M.D.   On: 04/02/2016 16:39   Ct Head Wo Contrast  Result Date: 04/02/2016 CLINICAL DATA:  Fever.  Declining mental status. EXAM: CT HEAD WITHOUT CONTRAST TECHNIQUE: Contiguous axial images were obtained from the base of the skull  through the vertex without intravenous contrast. COMPARISON:  02/18/2013 FINDINGS: Brain: Diffuse cerebral atrophy. Mild ventricular dilatation consistent with central atrophy. Low-attenuation changes throughout the deep white matter consistent with small vessel ischemia. No focal lesions or collections are identified. No abnormal extra-axial fluid collections. Gray-white matter junctions are distinct. Basal cisterns are not effaced. Vascular: Vascular calcifications are present throughout the carotid siphons and vertebrobasilar vessels. Skull: Calvarium appears intact. Sinuses/Orbits: No acute finding. Other: No significant changes since previous study. IMPRESSION: Chronic atrophy and small vessel ischemic changes. No acute intracranial abnormalities. Electronically Signed   By: Burman Nieves M.D.   On: 04/02/2016 21:59    Time Spent in minutes  30   Daisie Haft K M.D on 04/03/2016 at 9:20 AM  Between 7am to 7pm - Pager - [phone]  After 7pm go to [email or web address] - password [facility]  [facility] -  Office  [phone]

## 2016-04-04 DIAGNOSIS — J690 Pneumonitis due to inhalation of food and vomit: Principal | ICD-10-CM

## 2016-04-04 LAB — BASIC METABOLIC PANEL
Anion gap: 4 — ABNORMAL LOW (ref 5–15)
BUN: 12 mg/dL (ref 6–20)
CHLORIDE: 119 mmol/L — AB (ref 101–111)
CO2: 24 mmol/L (ref 22–32)
CREATININE: 0.63 mg/dL (ref 0.44–1.00)
Calcium: 7.6 mg/dL — ABNORMAL LOW (ref 8.9–10.3)
GFR calc Af Amer: >60 mL/min (ref >60)
GFR calc non Af Amer: >60 mL/min (ref >60)
GLUCOSE: 131 mg/dL — AB (ref 65–99)
Potassium: 3.8 mmol/L (ref 3.5–5.1)
Sodium: 147 mmol/L — ABNORMAL HIGH (ref 135–145)

## 2016-04-04 LAB — MAGNESIUM: Magnesium: 2.3 mg/dL (ref 1.7–2.4)

## 2016-04-04 MED ORDER — DEXTROSE 5 % IV SOLN
INTRAVENOUS | Status: DC
  Administered 2016-04-04: 11:00:00 via INTRAVENOUS

## 2016-04-04 MED ORDER — COLLAGENASE 250 UNIT/GM EX OINT
TOPICAL_OINTMENT | Freq: Every day | CUTANEOUS | Status: DC
Start: 2016-04-04 — End: 2016-04-07
  Administered 2016-04-05 – 2016-04-06 (×2): via TOPICAL
  Filled 2016-04-04: qty 30

## 2016-04-04 NOTE — Progress Notes (Signed)
Nutrition Brief Note  Chart reviewed. Pt now transitioning to comfort care.  No further nutrition interventions warranted at this time.  Please re-consult as needed.   Jarious Lyon A. Dung Prien, RD, LDN, CDE Pager: [phone] After hours Pager: [phone]  

## 2016-04-04 NOTE — Progress Notes (Signed)
  Speech Language Pathology Treatment: Dysphagia  Patient Details Name: Mandy Welch MRN: [id number] DOB: 09/23/1918 Today's Date: 04/04/2016 Time: [id number] SLP Time Calculation (min) (ACUTE ONLY): 17 min  Assessment / Plan / Recommendation Clinical Impression  It appears that Mandy Welch's swallow is unfortunately not functional at this stage of her dementia. Pt was as alert as she probably is at her baseline. She may rally and take ice chips/teaspoon thin liquid or puree for comfort although her awareness of her surroundings appeared decreased. Signs of a dementia marked swallow observed oral holding, anterior spill, suspected lingual pumping, delayed transit and removal of applesauce from mouth x 1. She is likely aspirating due to presence of delayed cough, throat clears. Multiple swallows observed. At this point would recommend sips and chips and puree textures if able and pt alert enough and MD, family in agreement. Will keep on caseload for potential education with family. May be good candidate for Palliative Care.    HPI HPI: 83 year old female with h/o vascular dementia (at baseline, able to communicate verbally, minimally), found to have right sided infiltrate suggestive of PNA, clinical dehydration with severe hypernatremia.       SLP Plan  Continue with current plan of care       Recommendations  Diet recommendations:  (comfort with puree and thin if decided upon) Liquids provided via: Teaspoon;Cup Medication Administration: Crushed with puree Supervision: Full supervision/cueing for compensatory strategies;Staff to assist with self feeding Compensations: Slow rate;Small sips/bites Postural Changes and/or Swallow Maneuvers:  (upright as able)                Oral Care Recommendations: Oral care QID Follow up Recommendations:  (TBD) SLP Visit Diagnosis: Dysphagia, unspecified (R13.10) Plan: Continue with current plan of care       GO                 Mandy Welch, Mandy Welch 04/04/2016, 10:03 AM  Mandy Welch M.Ed [employer]SLP Pager [phone]

## 2016-04-04 NOTE — Congregational Nurse Program (Signed)
WOC Nurse wound consult note Reason for Consult: pressure injury from SNF Wound type: 1. Unstageable Pressure Injury left trochanter: 2cm x 4cm x 0cm  2. Stage 2 Pressure Injury right lateral knee: 0.5cm x 0.5cm x 0.1cm  Pressure Injury POA: Yes Measurement: see above Wound bed: 1. 90% pink/10% black eschar centrally 2. 100% partial thickness, pink Drainage (amount, consistency, odor) none from either site Periwound: intact Dressing procedure/placement/frequency: Foam dressing to the right knee wound to protect and insulate Enzymatic debridement ointment to the left trochanter, top with 2x2 moist with saline, top with dry dressing. Change daily. Add low air loss mattress for pressure redistribution in high risk patient. Braden 11.  Discussed POC with patient and bedside nurse.  Re consult if needed, will not follow at this time. Thanks  Allisen Pidgeon M.D.[employer] MSN, RN,CWOCN, CNS [phone])

## 2016-04-04 NOTE — Progress Notes (Addendum)
PROGRESS NOTE                                                                                                                                                                                                             Patient Demographics:    Mandy Welch, is a 83 y.o. female, DOB - [birth year], ZOX:[id number]  Admit date - 04/02/2016   Admitting Physician Leroy Sea, MD  Outpatient Primary MD for the patient is Martha Clan, MD  LOS - 2  Chief Complaint  Patient presents with  . Altered Mental Status       Brief Narrative   Mandy Welch  is a 83 y.o. female, History of vascular dementia, severe protein calorie malnutrition with cachexia, SNF resident, anxiety and depression, who was brought in from nursing home due to decreased mental status, per entry as per chart review patient at baseline is verbal and able to communicate minimally, she was found to be nonverbal and minimally responsive at nursing home and transferred to the ER. In the ER she was found to have right-sided infiltrate suggestive of pneumonia, she appeared clinically dehydrated with severe hypernatremia.    Subjective:    Mandy Welch today Remains in bed appears in no distress but will not answer my questions or follow commands.   Assessment  & Plan :    1. Toxic and metabolic encephalopathy. This is due to combination of Asp.PNA and severe hypernatremia.  A. Aspiration pneumonia at SNF. Nothing by mouth except medications, follow blood cultures, continue Zosyn, speech to evaluate.Minimal clinical improvement.  B. Severe hypernatremia. Due to severe dehydration, continue IV fluids which are D5W and monitor BMP, hypernatremia improving. Minimal clinical improvement.  2. Advanced vascular dementia, severe cachexia, severe protein calorie malnutrition, failure to thrive. At this time supportive care. Will involve palliative  care for goals of care and future line of treatment, at this time will do simple medical treatmentwill be attempted. She is DO NOT RESUSCITATE.   Discussed with daughter-in-law agrees with the plan of gentle medical treatment and if no improvement then transitioned to full comfort care, she will be visiting the hospital on 04/21/2016. Have adverse palliative care for goals of care and likely to transition to full comfort care if no improvement soon.   3. Pt has large pressure ulcer on L buttocks with dressing  in place from SNF - see W care notes for details, W Care consulted   Home medications for now are held or minimized due to her encephalopathy, will reevaluate home medications once mental status has improved.   Addendum - 5.45 pm family came, dicussed at length, transfer to SNF with Full hospice tomorrow, stop IVF and ABX in am, full comfort.   Diet : Diet NPO time specified Except for: Sips with Meds    Family Communication  :  Left message for grandson on the listed cell phone on 04/02/2016  Code Status :  DNR  Disposition Plan  :  Discussed with daughter-in-law, okay with DO NOT RESUSCITATE and pursuing hospice.  Consults  :  Pall Care  Procedures  :  CT head - non acute  DVT Prophylaxis  :  Heparin   Lab Results  Component Value Date   PLT 102 (L) 04/03/2016    Inpatient Medications  Scheduled Meds: . bisacodyl  10 mg Rectal Daily  . heparin  5,000 Units Subcutaneous Q8H  . memantine  10 mg Oral BID  . piperacillin-tazobactam (ZOSYN)  IV  2.25 g Intravenous Q8H   Continuous Infusions: . dextrose 75 mL/hr at 04/04/16 1031   PRN Meds:.albuterol, HYDROcodone-acetaminophen, morphine injection, ondansetron (ZOFRAN) IV  Antibiotics  :    Anti-infectives    Start     Dose/Rate Route Frequency Ordered Stop   04/03/16 1700  piperacillin-tazobactam (ZOSYN) IVPB 2.25 g     2.25 g 100 mL/hr over 30 Minutes Intravenous Every 8 hours 04/03/16 0925     04/03/16 0100   piperacillin-tazobactam (ZOSYN) IVPB 3.375 g  Status:  Discontinued     3.375 g 12.5 mL/hr over 240 Minutes Intravenous Every 8 hours 04/02/16 1728 04/03/16 0925   04/02/16 1800  piperacillin-tazobactam (ZOSYN) IVPB 3.375 g     3.375 g 100 mL/hr over 30 Minutes Intravenous  Once 04/02/16 1728 04/02/16 1811         Objective:   Vitals:   04/03/16 0443 04/03/16 1431 04/03/16 2147 04/04/16 0553  BP: 90/60 132/82 (!) 112/49 (!) 108/55  Pulse: 78 87 81 82  Resp: 16 15 16 16   Temp: 99.3 F (37.4 C) 98.4 F (36.9 C) 98.2 F (36.8 C) 98.1 F (36.7 C)  TempSrc: Oral Oral Oral Oral  SpO2: 100% 94% 100% 98%  Weight:    34.9 kg (76 lb 15.1 oz)  Height:        Wt Readings from Last 3 Encounters:  04/04/16 34.9 kg (76 lb 15.1 oz)  03/21/16 34.7 kg (76 lb 8 oz)  02/01/16 34.6 kg (76 lb 4.8 oz)     Intake/Output Summary (Last 24 hours) at 04/04/16 1209 Last data filed at 04/04/16 0545  Gross per 24 hour  Intake             3430 ml  Output                0 ml  Net             3430 ml     Physical Exam  Weak, extremely debilitated and cachectic elderly African-American woman lying in hospital bed curled up , will not answer my questions [place].AT,PERRAL Supple Neck,No JVD, No cervical lymphadenopathy appriciated.  Symmetrical Chest wall movement, Good air movement bilaterally, CTAB RRR,No Gallops,Rubs or new Murmurs, No Parasternal Heave +ve B.Sounds, Abd Soft, No tenderness, No organomegaly appriciated, No rebound - guarding or rigidity. No Cyanosis, Clubbing or edema,  No new Rash or bruise      Data Review:    CBC  Recent Labs Lab 04/02/16 1555 04/03/16 0314  WBC 9.2 8.7  HGB 10.1* 8.7*  HCT 35.2* 29.8*  PLT 126* 102*  MCV 84.6 84.2  MCH 24.3* 24.6*  MCHC 28.7* 29.2*  RDW 18.5* 18.7*  LYMPHSABS 0.8  --   MONOABS 0.3  --   EOSABS 0.0  --   BASOSABS 0.0  --     Chemistries   Recent Labs Lab 04/02/16 1555 04/03/16 0314 04/04/16 0712  NA 166* 160* 147*  K  4.2 3.2* 3.8  CL 129* 125* 119*  CO2 30 28 24   GLUCOSE 148* 183* 131*  BUN 33* 26* 12  CREATININE 0.90 0.73 0.63  CALCIUM 8.6* 8.0* 7.6*  MG  --   --  2.3  AST 22  --   --   ALT 16  --   --   ALKPHOS 70  --   --   BILITOT 0.5  --   --    ------------------------------------------------------------------------------------------------------------------ No results for input(s): CHOL, HDL, LDLCALC, TRIG, CHOLHDL, LDLDIRECT in the last 72 hours.  No results found for: HGBA1C ------------------------------------------------------------------------------------------------------------------  Recent Labs  04/02/16 2110  TSH 2.301   ------------------------------------------------------------------------------------------------------------------ No results for input(s): VITAMINB12, FOLATE, FERRITIN, TIBC, IRON, RETICCTPCT in the last 72 hours.  Coagulation profile  Recent Labs Lab 04/02/16 2110  INR 1.09    No results for input(s): DDIMER in the last 72 hours.  Cardiac Enzymes No results for input(s): CKMB, TROPONINI, MYOGLOBIN in the last 168 hours.  Invalid input(s): CK ------------------------------------------------------------------------------------------------------------------ No results found for: BNP  Micro Results Recent Results (from the past 240 hour(s))  Blood Culture (routine x 2)     Status: None (Preliminary result)   Collection Time: 04/02/16  3:34 PM  Result Value Ref Range Status   Specimen Description BLOOD RIGHT HAND  Final   Special Requests IN PEDIATRIC BOTTLE 1CC  Final   Culture NO GROWTH < 24 HOURS  Final   Report Status PENDING  Incomplete  Blood Culture (routine x 2)     Status: None (Preliminary result)   Collection Time: 04/02/16  3:45 PM  Result Value Ref Range Status   Specimen Description BLOOD RIGHT ANTECUBITAL  Final   Special Requests BOTTLES DRAWN AEROBIC ONLY 5CC  Final   Culture NO GROWTH < 24 HOURS  Final   Report Status  PENDING  Incomplete  MRSA PCR Screening     Status: None   Collection Time: 04/02/16  9:03 PM  Result Value Ref Range Status   MRSA by PCR NEGATIVE NEGATIVE Final    Comment:        The GeneXpert MRSA Assay (FDA approved for NASAL specimens only), is one component of a comprehensive MRSA colonization surveillance program. It is not intended to diagnose MRSA infection nor to guide or monitor treatment for MRSA infections.     Radiology Reports Dg Chest 2 View  Result Date: 04/02/2016 CLINICAL DATA:  Altered mental status.  Fever. EXAM: CHEST  2 VIEW COMPARISON:  Two-view chest x-ray 03/12/2015. FINDINGS: The heart is mildly enlarged. Atherosclerotic calcifications are present at the aorta. Right lower and upper lobe pneumonia on a. Left lung is clear. Chronic interstitial coarsening is present. A nodular density in the left upper lobe is stable. Exaggerated kyphosis is present in the upper thoracic spine. IMPRESSION: 1. Right lower and probable upper lobe pneumonia is new. Electronically Signed  By: Marin Robertshristopher  Mattern M.D.   On: 04/02/2016 16:39   Ct Head Wo Contrast  Result Date: 04/02/2016 CLINICAL DATA:  Fever.  Declining mental status. EXAM: CT HEAD WITHOUT CONTRAST TECHNIQUE: Contiguous axial images were obtained from the base of the skull through the vertex without intravenous contrast. COMPARISON:  02/18/2013 FINDINGS: Brain: Diffuse cerebral atrophy. Mild ventricular dilatation consistent with central atrophy. Low-attenuation changes throughout the deep white matter consistent with small vessel ischemia. No focal lesions or collections are identified. No abnormal extra-axial fluid collections. Gray-white matter junctions are distinct. Basal cisterns are not effaced. Vascular: Vascular calcifications are present throughout the carotid siphons and vertebrobasilar vessels. Skull: Calvarium appears intact. Sinuses/Orbits: No acute finding. Other: No significant changes since previous  study. IMPRESSION: Chronic atrophy and small vessel ischemic changes. No acute intracranial abnormalities. Electronically Signed   By: Burman NievesWilliam  Stevens M.D.   On: 04/02/2016 21:59    Time Spent in minutes  30   SINGH,PRASHANT K M.D on 04/04/2016 at 12:09 PM  Between 7am to 7pm - Pager - [phone]  After 7pm go to [email or web address] - password [email or web address]  [facility] -  Office  [phone]

## 2016-04-05 DIAGNOSIS — R627 Adult failure to thrive: Secondary | ICD-10-CM

## 2016-04-05 DIAGNOSIS — G9340 Encephalopathy, unspecified: Secondary | ICD-10-CM

## 2016-04-05 MED ORDER — ALBUTEROL SULFATE (2.5 MG/3ML) 0.083% IN NEBU: 2.5000 mg | INHALATION_SOLUTION | RESPIRATORY_TRACT | 0 refills | Status: DC | PRN

## 2016-04-05 MED ORDER — MORPHINE SULFATE (CONCENTRATE) 10 MG/0.5ML PO SOLN: 10.0000 mg | ORAL | Status: DC | PRN

## 2016-04-05 MED ORDER — BISACODYL 10 MG RE SUPP: 10.0000 mg | Freq: Every day | RECTAL | 0 refills | Status: AC

## 2016-04-05 NOTE — Progress Notes (Signed)
This patient is most appropriate for a hospice facility for her care. She has complex wound care needs along with symptom management-please have CSW make referral to hospice house instead of SNF. Prognosis<2 weeks-not taking PO-full comfort.  Mandy MaltaElizabeth Elvenia Godden, DO Palliative Medicine [phone]

## 2016-04-05 NOTE — Discharge Summary (Addendum)
Physician Discharge Summary  Mandy Welch MWU:[id number] DOB: 10/02/1918 DOA: 04/02/2016  PCP: Marton Redwood, MD  Admit date: 04/02/2016 Discharge date: 04/06/2016  Admitted From: SNF Disposition:  [place]  Recommendations for Outpatient Follow-up:  1. Follow up hospice provider as needed  Discharge Condition:Stable CODE STATUS:DNR Diet recommendation: comfort   Brief/Interim Summary: 83 y.o.female,History of vascular dementia, severe protein calorie malnutrition with cachexia, SNF resident, anxiety and depression, who was brought in from nursing home due to decreased mental status,per entry as per chart review patient at baseline is verbal and able to communicate minimally, she was found to be nonverbal and minimally responsive at nursing home and transferred to the ER. In the ER she was found to have right-sided infiltrate suggestive of pneumonia, she appeared clinically dehydrated with severe hypernatremia.   1. Toxic and metabolic encephalopathy. This is due to combination of Asp.PNA and severe hypernatremia. Appears more conversant this AM.  A. Aspiration pneumonia at SNF. Nothing by mouth except medications, follow blood cultures, initially continued onZosyn. .Minimal clinical improvement. See below. After family meeting with Dr. Candiss Norse, pt decided to be full comfort status  B. Severe hypernatremia. Due to severe dehydration, continue IV fluids which are D5W and monitor BMP, hypernatremia improved, although patient noted to have minimal clinical improvement. Na had improved with IVF,however given severe demential, very likely will recur.  2.Advanced vascular dementia, severe cachexia, severe protein calorie malnutrition, failure to thrive. Dr. Candiss Norse had met with family on 3/13 and updated at length. Family decision was made for transition to full comfort. See documentation. Discussed with patient's grandson at contact listed, who initially agreed with plan to d/c to  SNF with hospice involvement. Appreciate input by Palliative Care. Patient would better be suited at residential hospice with expected <2 week prognosis.  3. Unstagable pressure injury to L trochanter and stage 2 pressure sore to R lateral knee: - see W care notes for details, W Care consulted. Recs noted  Discharge Diagnoses:  Principal Problem:   Acute hyponatremia Active Problems:   Failure to thrive in adult   Vascular dementia without behavioral disturbance   Depression   History of cachexia   Metabolic encephalopathy   Aspiration pneumonia ([place])    Discharge Instructions   Allergies as of 04/06/2016   No Known Allergies     Medication List    STOP taking these medications   acetaminophen 325 MG tablet Commonly known as:  TYLENOL   memantine 10 MG tablet Commonly known as:  NAMENDA   mirtazapine 15 MG tablet Commonly known as:  REMERON   polyethylene glycol packet Commonly known as:  MIRALAX / GLYCOLAX   senna 8.6 MG Tabs tablet Commonly known as:  SENOKOT   senna-docusate 8.6-50 MG tablet Commonly known as:  Senokot-S   UNABLE TO FIND     TAKE these medications   albuterol (2.5 MG/3ML) 0.083% nebulizer solution Commonly known as:  PROVENTIL Take 3 mLs (2.5 mg total) by nebulization every 4 (four) hours as needed for wheezing or shortness of breath.   bisacodyl 10 MG suppository Commonly known as:  DULCOLAX Place 1 suppository (10 mg total) rectally daily.   docusate sodium 100 MG capsule Commonly known as:  COLACE Take 100 mg by mouth 2 (two) times daily.   morphine CONCENTRATE 10 MG/0.5ML Soln concentrated solution Take 0.5 mLs (10 mg total) by mouth every 2 (two) hours as needed for moderate pain, severe pain, anxiety or shortness of breath.      Follow-up Information  Marton Redwood, MD. Schedule an appointment as soon as possible for a visit in 2 week(s).   Specialty:  Internal Medicine Why:  as needed Contact information: [address] [place] [address] [phone]          No Known Allergies  Procedures/Studies: Dg Chest 2 View  Result Date: 04/02/2016 CLINICAL DATA:  Altered mental status.  Fever. EXAM: CHEST  2 VIEW COMPARISON:  Two-view chest x-ray 03/12/2015. FINDINGS: The heart is mildly enlarged. Atherosclerotic calcifications are present at the aorta. Right lower and upper lobe pneumonia on a. Left lung is clear. Chronic interstitial coarsening is present. A nodular density in the left upper lobe is stable. Exaggerated kyphosis is present in the upper thoracic spine. IMPRESSION: 1. Right lower and probable upper lobe pneumonia is new. Electronically Signed   By: San Morelle M.D.   On: 04/02/2016 16:39   Ct Head Wo Contrast  Result Date: 04/02/2016 CLINICAL DATA:  Fever.  Declining mental status. EXAM: CT HEAD WITHOUT CONTRAST TECHNIQUE: Contiguous axial images were obtained from the base of the skull through the vertex without intravenous contrast. COMPARISON:  02/18/2013 FINDINGS: Brain: Diffuse cerebral atrophy. Mild ventricular dilatation consistent with central atrophy. Low-attenuation changes throughout the deep white matter consistent with small vessel ischemia. No focal lesions or collections are identified. No abnormal extra-axial fluid collections. Gray-white matter junctions are distinct. Basal cisterns are not effaced. Vascular: Vascular calcifications are present throughout the carotid siphons and vertebrobasilar vessels. Skull: Calvarium appears intact. Sinuses/Orbits: No acute finding. Other: No significant changes since previous study. IMPRESSION: Chronic atrophy and small vessel ischemic changes. No acute intracranial abnormalities. Electronically Signed   By: Lucienne Capers M.D.   On: 04/02/2016 21:59    Subjective: No complaints  Discharge Exam: Vitals:   04/06/16 0522 04/06/16 1347  BP: (!) 100/50 (!) 93/46  Pulse: 95 71  Resp: 15 14  Temp: 97.8 F (36.6 C) 97.3 F  (36.3 C)   Vitals:   04/05/16 1435 04/05/16 2117 04/06/16 0522 04/06/16 1347  BP: 134/72 106/65 (!) 100/50 (!) 93/46  Pulse: 84 100 95 71  Resp: '16 16 15 14  ' Temp: 97.9 F (36.6 C) 98.2 F (36.8 C) 97.8 F (36.6 C) 97.3 F (36.3 C)  TempSrc: Axillary Axillary Axillary Axillary  SpO2: 98% 99% 100% 98%  Weight:      Height:        General: Pt is asleep, easily arousable, not in acute distress Cardiovascular: RRR, S1/S2 +, no rubs, no gallops Respiratory: CTA bilaterally, no wheezing, no rhonchi Abdominal: Soft, NT, ND, bowel sounds + Extremities: no edema, no cyanosis   The results of significant diagnostics from this hospitalization (including imaging, microbiology, ancillary and laboratory) are listed below for reference.     Microbiology: Recent Results (from the past 240 hour(s))  Blood Culture (routine x 2)     Status: None (Preliminary result)   Collection Time: 04/02/16  3:34 PM  Result Value Ref Range Status   Specimen Description BLOOD RIGHT HAND  Final   Special Requests IN PEDIATRIC BOTTLE 1CC  Final   Culture NO GROWTH 4 DAYS  Final   Report Status PENDING  Incomplete  Blood Culture (routine x 2)     Status: None (Preliminary result)   Collection Time: 04/02/16  3:45 PM  Result Value Ref Range Status   Specimen Description BLOOD RIGHT ANTECUBITAL  Final   Special Requests BOTTLES DRAWN AEROBIC ONLY 5CC  Final   Culture NO GROWTH 4  DAYS  Final   Report Status PENDING  Incomplete  MRSA PCR Screening     Status: None   Collection Time: 04/02/16  9:03 PM  Result Value Ref Range Status   MRSA by PCR NEGATIVE NEGATIVE Final    Comment:        The GeneXpert MRSA Assay (FDA approved for NASAL specimens only), is one component of a comprehensive MRSA colonization surveillance program. It is not intended to diagnose MRSA infection nor to guide or monitor treatment for MRSA infections.      Labs: BNP (last 3 results) No results for input(s): BNP in the  last 8760 hours. Basic Metabolic Panel:  Recent Labs Lab 04/02/16 1555 04/03/16 0314 04/04/16 0712  NA 166* 160* 147*  K 4.2 3.2* 3.8  CL 129* 125* 119*  CO2 '30 28 24  ' GLUCOSE 148* 183* 131*  BUN 33* 26* 12  CREATININE 0.90 0.73 0.63  CALCIUM 8.6* 8.0* 7.6*  MG  --   --  2.3   Liver Function Tests:  Recent Labs Lab 04/02/16 1555  AST 22  ALT 16  ALKPHOS 70  BILITOT 0.5  PROT 6.0*  ALBUMIN 2.5*   No results for input(s): LIPASE, AMYLASE in the last 168 hours. No results for input(s): AMMONIA in the last 168 hours. CBC:  Recent Labs Lab 04/02/16 1555 04/03/16 0314  WBC 9.2 8.7  NEUTROABS 8.1*  --   HGB 10.1* 8.7*  HCT 35.2* 29.8*  MCV 84.6 84.2  PLT 126* 102*   Cardiac Enzymes: No results for input(s): CKTOTAL, CKMB, CKMBINDEX, TROPONINI in the last 168 hours. BNP: Invalid input(s): POCBNP CBG: No results for input(s): GLUCAP in the last 168 hours. D-Dimer No results for input(s): DDIMER in the last 72 hours. Hgb A1c No results for input(s): HGBA1C in the last 72 hours. Lipid Profile No results for input(s): CHOL, HDL, LDLCALC, TRIG, CHOLHDL, LDLDIRECT in the last 72 hours. Thyroid function studies No results for input(s): TSH, T4TOTAL, T3FREE, THYROIDAB in the last 72 hours.  Invalid input(s): FREET3 Anemia work up No results for input(s): VITAMINB12, FOLATE, FERRITIN, TIBC, IRON, RETICCTPCT in the last 72 hours. Urinalysis    Component Value Date/Time   COLORURINE YELLOW 04/02/2016 1612   APPEARANCEUR CLOUDY (A) 04/02/2016 1612   LABSPEC 1.021 04/02/2016 1612   PHURINE 5.0 04/02/2016 1612   GLUCOSEU NEGATIVE 04/02/2016 1612   HGBUR NEGATIVE 04/02/2016 1612   BILIRUBINUR NEGATIVE 04/02/2016 1612   KETONESUR 5 (A) 04/02/2016 1612   PROTEINUR 30 (A) 04/02/2016 1612   UROBILINOGEN 0.2 05/30/2013 1852   NITRITE NEGATIVE 04/02/2016 1612   LEUKOCYTESUR NEGATIVE 04/02/2016 1612   Sepsis Labs Invalid input(s): PROCALCITONIN,  WBC,   LACTICIDVEN Microbiology Recent Results (from the past 240 hour(s))  Blood Culture (routine x 2)     Status: None (Preliminary result)   Collection Time: 04/02/16  3:34 PM  Result Value Ref Range Status   Specimen Description BLOOD RIGHT HAND  Final   Special Requests IN PEDIATRIC BOTTLE 1CC  Final   Culture NO GROWTH 4 DAYS  Final   Report Status PENDING  Incomplete  Blood Culture (routine x 2)     Status: None (Preliminary result)   Collection Time: 04/02/16  3:45 PM  Result Value Ref Range Status   Specimen Description BLOOD RIGHT ANTECUBITAL  Final   Special Requests BOTTLES DRAWN AEROBIC ONLY 5CC  Final   Culture NO GROWTH 4 DAYS  Final   Report Status PENDING  Incomplete  MRSA PCR Screening     Status: None   Collection Time: 04/02/16  9:03 PM  Result Value Ref Range Status   MRSA by PCR NEGATIVE NEGATIVE Final    Comment:        The GeneXpert MRSA Assay (FDA approved for NASAL specimens only), is one component of a comprehensive MRSA colonization surveillance program. It is not intended to diagnose MRSA infection nor to guide or monitor treatment for MRSA infections.      SIGNED:   Donne Hazel, MD  [facility] 04/06/2016, 2:47 PM  If 7PM-7AM, please contact night-coverage [email or web address] Password [email or web address]

## 2016-04-06 DIAGNOSIS — E871 Hypo-osmolality and hyponatremia: Secondary | ICD-10-CM

## 2016-04-06 DIAGNOSIS — G9341 Metabolic encephalopathy: Secondary | ICD-10-CM

## 2016-04-06 DIAGNOSIS — F329 Major depressive disorder, single episode, unspecified: Secondary | ICD-10-CM

## 2016-04-06 DIAGNOSIS — L8990 Pressure ulcer of unspecified site, unspecified stage: Secondary | ICD-10-CM | POA: Insufficient documentation

## 2016-04-06 MED ORDER — MORPHINE SULFATE (CONCENTRATE) 10 MG/0.5ML PO SOLN: 10.0000 mg | ORAL | Status: DC | PRN

## 2016-04-06 NOTE — Clinical Social Work Note (Addendum)
Clinical Social Work Assessment  Patient Details  Name: Mandy Welch MRN: [id number] Date of Birth: 03/23/1918  Date of referral:  04/06/16               Reason for consult:  End of Life/Hospice (Residential)                Permission sought to share information with:  Family Supports Permission granted to share information::   (Pt non-verbal)  Name::     Environmental consultantAnnette Beegle  Agency::  [facility]  Relationship::  Daughter   Contact Information:  [phone]  Housing/Transportation Living arrangements for the past 2 months:  Skilled Nursing Facility ([facility]) Source of Information:  Adult Children Patient Interpreter Needed:  None Criminal Activity/Legal Involvement Pertinent to Current Situation/Hospitalization:  No - Comment as needed Significant Relationships:  Adult Children, Other Family Members (Grandson-Cedric Tavenner) Lives with:  Facility Resident Do you feel safe going back to the place where you live?  Yes Need for family participation in patient care:  Yes (Comment)  Care giving concerns:  No caregiving concerns identified.    Social Worker assessment / plan: CSW attempted to meet with pt bedside to address consult for returning to [facility]. Pt sleeping soundly and did not respond to several prompts. CSW spoke with dtr-Annette via phone. Dtr agreed that [facility].   Dr. Phillips OdorGolding recommended hospice residential for pt and contacted family with this information. [facility] in agreement and [facility]. [facility] and [facility]. Family completed paperwork with family. [facility] and arranged [facility] via [facility]. CSW communicated this with family, Carley Hammedva, and Charity fundraiserN. RN report information in treatment team sticky note. CSW signing off as no further SW needs identified.   Employment status:  Retired Health and safety inspectornsurance information:   Armed forces operational officerMedicare, Medicaid [place] PT Recommendations:  Not assessed at this time Information / Referral to community resources:  Other (Comment Required) (Residential Hospice)  Patient/Family's Response to care:  Family appreciative of CSW care coordination and support.   Patient/Family's Understanding of and Emotional Response to Diagnosis, Current Treatment, and Prognosis:  Family feels residential hospice is the appropriate setting for pt and are aware of diagnosis and prognosis.   Emotional Assessment Appearance:  Appears stated age Attitude/Demeanor/Rapport:   (Appropriate) Affect (typically observed):  Unable to Assess (Sleeping) Orientation:  Oriented to Self Alcohol / Substance use:  Other Psych involvement (Current and /or in the community):  No (Comment)  Discharge Needs  Concerns to be addressed:  Care Coordination Readmission within the last 30 days:  No Current discharge risk:  Terminally ill Barriers to Discharge:  Continued Medical Work up   Dominic PeaJeneya G Jelan Batterton, LCSW 04/06/2016, 7:47 PM

## 2016-04-06 NOTE — Progress Notes (Signed)
Call placed to patient's grandson, Mandy Welch to discuss hospice options. Left message for return call this AM so that we can assist with care planning. Primary service discussed transfer back to [facility] with hospice services, and option of hospice facility was not discussed with the family. A transfer back to SNF with clear reassurance and plan that hospice will see on the day of discharge may be an acceptable plan based on what the family wants for her EOL care, but discussing the option of hospice facility placement/referral should be offered.  My concern is that patient is approaching EOL and will have escalating symptom management needs including the need for frequent pain assessment, pre-medication for wound care and turning. Additionally she is high risk for aspiration and has PNA which will likely be cause of her death. She is on full comfort care at this point. I have recommended PRN roxanol for pain and dyspnea. Comfort feeding cautiously. Will await call back from family-[facility] since per my request a referral has been made.  Mandy MaltaElizabeth Casady Voshell, DO Palliative Medicine [phone]

## 2016-04-06 NOTE — Consult Note (Signed)
[facility]ia[facility] Received request from CSW Tonette BihariJeneya 04/05/16 for [facility]. Chart reviewed and spoke with patient's daughter by phone this morning to confirm interest. Family agreeable to transfer today. They are available to come to hospital to complete paper work after 4:30 pm. They have requested Dr. Kern Reaponald Hertweck to [facility]. CSW BarnesdaleJeneya aware.   Please fax discharge summary to [phone].  RN please call report to [phone].  Thank you,  Forrestine Himva Davis, LCSW [phone]

## 2016-04-06 NOTE — Progress Notes (Signed)
Speech Language Pathology Discharge Patient Details Name: Mandy BoxerGracie Welch MRN: [id number] DOB: 11/08/1918 Today's Date: 04/06/2016 Time:  -     Patient discharged from SLP services secondary to pt continues to decline, full comfort care and feeds if awake and appropriate. Discharging to SNF with hospice  Please see latest therapy progress note for current level of functioning and progress toward goals.    Progress and discharge plan discussed with patient and/or caregiver: pt unable     Royce MacadamiaLitaker, Aybree Lanyon Willis 04/06/2016, 11:26 AM    Breck CoonsLisa Willis Lonell FaceLitaker M.Ed [employer] Pager [phone]

## 2016-04-06 NOTE — Progress Notes (Signed)
PROGRESS NOTE    Mandy Welch  NBZ:[id number] DOB: 04-08-1918 DOA: 04/02/2016 PCP: Marton Redwood, MD    Brief Narrative:  83 y.o.female,History of vascular dementia, severe protein calorie malnutrition with cachexia, SNF resident, anxiety and depression, who was brought in from nursing home due to decreased mental status,per entry as per chart review patient at baseline is verbal and able to communicate minimally, she was found to be nonverbal and minimally responsive at nursing home and transferred to the ER. In the ER she was found to have right-sided infiltrate suggestive of pneumonia, she appeared clinically dehydrated with severe hypernatremia.   Assessment & Plan:   Principal Problem:   Acute hyponatremia Active Problems:   Failure to thrive in adult   Vascular dementia without behavioral disturbance   Depression   History of cachexia   Metabolic encephalopathy   Aspiration pneumonia ([place])   1. Toxic and metabolic encephalopathy. This is due to combination of Asp.PNA and severe hypernatremia. Appears more conversant this AM.  A. Aspiration pneumonia at SNF. Nothing by mouth except medications, follow blood cultures, initially continued on Zosyn. .Minimal clinical improvement. See below. After family meeting with Dr. Candiss Norse, pt decided to be full comfort status  B. Severe hypernatremia. Due to severe dehydration, continue IV fluids which are D5W and monitor BMP, hypernatremia improved, although patient noted to have minimal clinical improvement. Na had improved with IVF,however given severe demential, very likely will recur.   2.Advanced vascular dementia, severe cachexia, severe protein calorie malnutrition, failure to thrive. Dr. Candiss Norse had met with family on 3/13 and updated at length. Family decision was made for transition to full comfort. See documentation. Discussed with patient's grandson at contact listed, who initially agreed with plan to d/c to SNF with hospice  involvement. Appreciate input by Palliative Care. Patient would better be suited at residential hospice with expected <2 week prognosis.  3. Unstagable pressure injury to L trochanter and stage 2 pressure sore to R lateral knee: - see W care notes for details, W Care consulted. Recs noted  DVT prophylaxis: comfort care Code Status: DNR/comfort Family Communication: Pt in room, family not currently at bedside Disposition Plan: [place] within 24hrs  Consultants:   Palliative Care  Procedures:     Antimicrobials: Anti-infectives    Start     Dose/Rate Route Frequency Ordered Stop   04/03/16 1700  piperacillin-tazobactam (ZOSYN) IVPB 2.25 g  Status:  Discontinued     2.25 g 100 mL/hr over 30 Minutes Intravenous Every 8 hours 04/03/16 0925 04/04/16 1758   04/03/16 0100  piperacillin-tazobactam (ZOSYN) IVPB 3.375 g  Status:  Discontinued     3.375 g 12.5 mL/hr over 240 Minutes Intravenous Every 8 hours 04/02/16 1728 04/03/16 0925   04/02/16 1800  piperacillin-tazobactam (ZOSYN) IVPB 3.375 g     3.375 g 100 mL/hr over 30 Minutes Intravenous  Once 04/02/16 1728 04/02/16 1811       Subjective: No complaints  Objective: Vitals:   04/05/16 1435 04/05/16 2117 04/06/16 0522 04/06/16 1347  BP: 134/72 106/65 (!) 100/50 (!) 93/46  Pulse: 84 100 95 71  Resp: '16 16 15 14  ' Temp: 97.9 F (36.6 C) 98.2 F (36.8 C) 97.8 F (36.6 C) 97.3 F (36.3 C)  TempSrc: Axillary Axillary Axillary Axillary  SpO2: 98% 99% 100% 98%  Weight:      Height:        Intake/Output Summary (Last 24 hours) at 04/06/16 1433 Last data filed at 04/06/16 1414  Gross per  24 hour  Intake              340 ml  Output                0 ml  Net              340 ml   Filed Weights   04/03/16 0434 04/04/16 0553 04/05/16 0500  Weight: 29.8 kg (65 lb 11.2 oz) 34.9 kg (76 lb 15.1 oz) 37 kg (81 lb 9.1 oz)    Examination:  General exam: Laying in bed, in nad  Respiratory system: Clear to  auscultation. Respiratory effort normal. Cardiovascular system: S1 & S2 heard, RRR. No JVD, murmurs, rubs, gallops or clicks. No pedal edema. Gastrointestinal system: Abdomen is nondistended, soft and nontender. No organomegaly or masses felt. Normal bowel sounds heard. Central nervous system: no tremors. No focal neurological deficits. Extremities: Symmetric 5 x 5 power. Skin: No rashes, lesions Psychiatry: confused, difficult to fully assess. Mood & affect appropriate.   Data Reviewed: I have personally reviewed following labs and imaging studies  CBC:  Recent Labs Lab 04/02/16 1555 04/03/16 0314  WBC 9.2 8.7  NEUTROABS 8.1*  --   HGB 10.1* 8.7*  HCT 35.2* 29.8*  MCV 84.6 84.2  PLT 126* 174*   Basic Metabolic Panel:  Recent Labs Lab 04/02/16 1555 04/03/16 0314 04/04/16 0712  NA 166* 160* 147*  K 4.2 3.2* 3.8  CL 129* 125* 119*  CO2 '30 28 24  ' GLUCOSE 148* 183* 131*  BUN 33* 26* 12  CREATININE 0.90 0.73 0.63  CALCIUM 8.6* 8.0* 7.6*  MG  --   --  2.3   GFR: Estimated Creatinine Clearance: 21.1 mL/min (by C-G formula based on SCr of 0.63 mg/dL). Liver Function Tests:  Recent Labs Lab 04/02/16 1555  AST 22  ALT 16  ALKPHOS 70  BILITOT 0.5  PROT 6.0*  ALBUMIN 2.5*   No results for input(s): LIPASE, AMYLASE in the last 168 hours. No results for input(s): AMMONIA in the last 168 hours. Coagulation Profile:  Recent Labs Lab 04/02/16 2110  INR 1.09   Cardiac Enzymes: No results for input(s): CKTOTAL, CKMB, CKMBINDEX, TROPONINI in the last 168 hours. BNP (last 3 results) No results for input(s): PROBNP in the last 8760 hours. HbA1C: No results for input(s): HGBA1C in the last 72 hours. CBG: No results for input(s): GLUCAP in the last 168 hours. Lipid Profile: No results for input(s): CHOL, HDL, LDLCALC, TRIG, CHOLHDL, LDLDIRECT in the last 72 hours. Thyroid Function Tests: No results for input(s): TSH, T4TOTAL, FREET4, T3FREE, THYROIDAB in the last 72  hours. Anemia Panel: No results for input(s): VITAMINB12, FOLATE, FERRITIN, TIBC, IRON, RETICCTPCT in the last 72 hours. Sepsis Labs:  Recent Labs Lab 04/02/16 1601 04/02/16 1839  LATICACIDVEN 1.94* 2.60*    Recent Results (from the past 240 hour(s))  Blood Culture (routine x 2)     Status: None (Preliminary result)   Collection Time: 04/02/16  3:34 PM  Result Value Ref Range Status   Specimen Description BLOOD RIGHT HAND  Final   Special Requests IN PEDIATRIC BOTTLE 1CC  Final   Culture NO GROWTH 3 DAYS  Final   Report Status PENDING  Incomplete  Blood Culture (routine x 2)     Status: None (Preliminary result)   Collection Time: 04/02/16  3:45 PM  Result Value Ref Range Status   Specimen Description BLOOD RIGHT ANTECUBITAL  Final   Special Requests BOTTLES DRAWN AEROBIC ONLY  5CC  Final   Culture NO GROWTH 3 DAYS  Final   Report Status PENDING  Incomplete  MRSA PCR Screening     Status: None   Collection Time: 04/02/16  9:03 PM  Result Value Ref Range Status   MRSA by PCR NEGATIVE NEGATIVE Final    Comment:        The GeneXpert MRSA Assay (FDA approved for NASAL specimens only), is one component of a comprehensive MRSA colonization surveillance program. It is not intended to diagnose MRSA infection nor to guide or monitor treatment for MRSA infections.      Radiology Studies: No results found.  Scheduled Meds: . bisacodyl  10 mg Rectal Daily  . collagenase   Topical Daily   Continuous Infusions:   LOS: 4 days   Shephanie Romas, Orpah Melter, MD [facility] Pager [phone]  If 7PM-7AM, please contact night-coverage [email or web address] Password [facility] 04/06/2016, 2:33 PM

## 2016-04-07 LAB — CULTURE, BLOOD (ROUTINE X 2)
CULTURE: NO GROWTH
Culture: NO GROWTH

## 2016-04-07 NOTE — Progress Notes (Signed)
Patient was [facility] via [facility]. Report was called and all questions answered.  Cindee SaltMcBride,Stefana Lodico K, RN

## 2016-04-28 ENCOUNTER — Non-Acute Institutional Stay (SKILLED_NURSING_FACILITY): Payer: Medicare Other | Admitting: Adult Health

## 2016-04-28 ENCOUNTER — Encounter: Payer: Self-pay | Admitting: Adult Health

## 2016-04-28 DIAGNOSIS — F32A Depression, unspecified: Secondary | ICD-10-CM

## 2016-04-28 DIAGNOSIS — K5901 Slow transit constipation: Secondary | ICD-10-CM | POA: Diagnosis not present

## 2016-04-28 DIAGNOSIS — F0150 Vascular dementia without behavioral disturbance: Secondary | ICD-10-CM | POA: Diagnosis not present

## 2016-04-28 DIAGNOSIS — F329 Major depressive disorder, single episode, unspecified: Secondary | ICD-10-CM | POA: Diagnosis not present

## 2016-04-28 DIAGNOSIS — R627 Adult failure to thrive: Secondary | ICD-10-CM | POA: Diagnosis not present

## 2016-04-28 NOTE — Progress Notes (Signed)
Location:   [facility] Room Number: 113 Place of Service:  SNF (31) Synthia Innocent NP  CODE STATUS: DNR  No Known Allergies  Chief Complaint  Patient presents with  . Hospitalization Follow-up    HPI:  She had been for aspiration pneumonia and dehydration. Her family upon comfort care and she was transferred to [facility]. She has been transferred to this facility for further hospice care. She is unable to participate in the hpi or ros. There are no nursing concerns a this time.   Past Medical History:  Diagnosis Date  . Constipation   . Dementia in Alzheimer's disease   . Kyphosis   . Slow transit constipation 08/24/2015  . Vascular dementia without behavioral disturbance   . Vitamin D deficiency     No past surgical history on file.  Social History   Social History  . Marital status: Single    Spouse name: N/A  . Number of children: N/A  . Years of education: N/A   Occupational History  . Not on file.   Social History Main Topics  . Smoking status: Unknown If Ever Smoked  . Smokeless tobacco: Not on file  . Alcohol use No  . Drug use: No  . Sexual activity: No   Other Topics Concern  . Not on file   Social History Narrative  . No narrative on file   Family History  Problem Relation Age of Onset  . Family history unknown: Yes      VITAL SIGNS BP (!) 107/56   Pulse (!) 102   Temp (!) 100.8 F (38.2 C)   Resp 16   Ht  (1.372 m)   Wt 72 lb 1.6 oz (32.7 kg)   SpO2 99%   BMI 17.38 kg/m   Patient's Medications  New Prescriptions   No medications on file  Previous Medications   ACETAMINOPHEN (TYLENOL) 325 MG TABLET    Take 650 mg by mouth every 6 (six) hours as needed for mild pain.   BISACODYL (DULCOLAX) 10 MG SUPPOSITORY    Place 1 suppository (10 mg total) rectally daily.   DOCUSATE SODIUM (COLACE) 100 MG CAPSULE    Take 100 mg by mouth 2 (two) times daily.   LORAZEPAM (ATIVAN) 0.5 MG TABLET    Take 0.5 mg by mouth every 4  (four) hours as needed for anxiety.   MEMANTINE (NAMENDA) 10 MG TABLET    Take 10 mg by mouth 2 (two) times daily.   MIRTAZAPINE (REMERON) 15 MG TABLET    Take 15 mg by mouth at bedtime.   MORPHINE 20 MG/5ML SOLUTION    Take 5 mg by mouth every 4 (four) hours as needed for pain.   NUTRITIONAL SUPPLEMENTS PO    Take by mouth. Give Med Pass 120 by mouth 3 times a day for prophylaxis   POLYETHYLENE GLYCOL (MIRALAX / GLYCOLAX) PACKET    Take 17 g by mouth daily.   SENNA (SENOKOT) 8.6 MG TABLET    Take 1 tablet by mouth 2 (two) times daily.  Modified Medications   No medications on file  Discontinued Medications   ALBUTEROL (PROVENTIL) (2.5 MG/3ML) 0.083% NEBULIZER SOLUTION    Take 3 mLs (2.5 mg total) by nebulization every 4 (four) hours as needed for wheezing or shortness of breath.   MORPHINE SULFATE (MORPHINE CONCENTRATE) 10 MG/0.5ML SOLN CONCENTRATED SOLUTION    Take 0.5 mLs (10 mg total) by mouth every 2 (two) hours as needed for moderate  pain, severe pain, anxiety or shortness of breath.     SIGNIFICANT DIAGNOSTIC EXAMS  04-02-16: 1. Right lower and probable upper lobe pneumonia is new.  04-02-16: ct of head: Chronic atrophy and small vessel ischemic changes. No acute intracranial abnormalities.    LABS REVIEWED:   08-27-15: wbc 7.1 hgb 11.0; hct 37.4; mcv 83.6; plt 189; glucose 116; bun 20.2; creat 0.68; k+ 4.1; na++ 143; liver normal albumin 4.3  10-27-15: wbc 7.1; hgb 11.0; hct 37.4; mcv 83.6; plt 189; glucose 82; bun 14.9; creat 0.62; k+ 4.3; na++ 144; liver normal albumin 3.7  04-02-16: wbc 9.2; hgb 10.1; hct 35.2; mcv 84.6; plt 126; glucose 148; bun 33; creat 0.9; k+ 3.0; na++ 166; liver normal albumin 2.5 tsh 2.301 04-03-16: wbc 8.7; hgb 8.7; hct 29.8; mcv 84.2; plt 102; glucose 183; bun 26; creat 0.73; k+ 3.2; na++ 160   Review of Systems  Unable to perform ROS: Dementia   Physical Exam  Constitutional: No distress.  cathectic  Eyes: Conjunctivae are normal.  Neck: Neck  supple. No JVD present. No thyromegaly present.  Cardiovascular: Normal rate, regular rhythm and intact distal pulses.   Respiratory: Effort normal and breath sounds normal. No respiratory distress. She has no wheezes.  GI: Soft. Bowel sounds are normal. She exhibits no distension. There is no tenderness.  Musculoskeletal: She exhibits edema.  Has kyphosis Has bilateral trace lower extremity edema   Lymphadenopathy:    She has no cervical adenopathy.  Neurological: She is alert.  Skin: Skin is warm and dry. She is not diaphoretic.  Psychiatric: She has a normal mood and affect.    ASSESSMENT/ PLAN:  1. Vascular dementia: will continue namenda 10 mg twice daily   2. Constipation: will continue dulcolax supp daily; miralax daily  senna twice daily colace twice daily   3. Depression and anxiety: will continue remeron 15 mg nightly and has ativan 0.5 mg every 4 hours as needed  4. Failure to thrive: is followed by hospice care will continue roxanol 5 mg every 4 hours as needed for any discomfort she may have.    Time spent with patient  50  minutes >50% time spent counseling; reviewing medical record; tests; labs; and developing future plan of care    MD is aware of resident's narcotic use and is in agreement with current plan of care. We will attempt to wean resident as apropriate   Synthia Innocent NP [facility]  Contact [phone] Monday through Friday 8am- 5pm  After hours call [phone]

## 2016-05-03 ENCOUNTER — Encounter: Payer: Self-pay | Admitting: Internal Medicine

## 2016-05-03 ENCOUNTER — Non-Acute Institutional Stay (SKILLED_NURSING_FACILITY): Payer: Medicare Other | Admitting: Internal Medicine

## 2016-05-03 DIAGNOSIS — R627 Adult failure to thrive: Secondary | ICD-10-CM | POA: Diagnosis not present

## 2016-05-03 DIAGNOSIS — E870 Hyperosmolality and hypernatremia: Secondary | ICD-10-CM | POA: Diagnosis not present

## 2016-05-03 DIAGNOSIS — J690 Pneumonitis due to inhalation of food and vomit: Secondary | ICD-10-CM | POA: Diagnosis not present

## 2016-05-03 NOTE — Progress Notes (Signed)
Provider:  Einar Crow Location:    [facility] Room Number: 113/B Place of Service:  SNF (31)  PCP: Martha Clan, MD Patient Care Team: Martha Clan, MD as PCP - General (Internal Medicine)  Extended Emergency Contact Information Primary Emergency Contact: Dola Factor [place] Home Phone: [phone] Relation: Grandson Secondary Emergency Contact: Suzi Roots [place] Home Phone: [phone] Relation: Daughter  Code Status: DNR Goals of Care: Advanced Directive information Advanced Directives 05/03/2016  Does Patient Have a Medical Advance Directive? Yes  Type of Advance Directive Out of facility DNR (pink MOST or yellow form)  Does patient want to make changes to medical advance directive? No - Patient declined  Would patient like information on creating a medical advance directive? -  Pre-existing out of facility DNR order (yellow form or pink MOST form) -      Chief Complaint  Patient presents with  . Re Admit To SNF    HPI: Patient is a 83 y.o. female seen today for admission to SNF for Comfort Care.  Patient has h/o Dementia, Severe Kyphosis, Failure to thrive and Protein Calorie Malnutrition was long term resident of facility. Staff has noticed that patient was less then usual responsive and was send to the hospital. Where she was admitted with Aspiration Pneumonia and Hypernatremia. After d/w family it  Was decided not for any aggressive Measures. She was initially send to [facility] and then transferred to this facility for comfort care. Patient is unable to give me any history. She is lying in the bed and seems comfortable.   Past Medical History:  Diagnosis Date  . Constipation   . Dementia in Alzheimer's disease   . Kyphosis   . Slow transit constipation 08/24/2015  . Vascular dementia without behavioral disturbance   . Vitamin D deficiency    History reviewed. No  pertinent surgical history.  reports that she has never smoked. She has never used smokeless tobacco. She reports that she does not drink alcohol or use drugs. Social History   Social History  . Marital status: Single    Spouse name: N/A  . Number of children: N/A  . Years of education: N/A   Occupational History  . Not on file.   Social History Main Topics  . Smoking status: Never Smoker  . Smokeless tobacco: Never Used  . Alcohol use No  . Drug use: No  . Sexual activity: No   Other Topics Concern  . Not on file   Social History Narrative  . No narrative on file    Functional Status Survey:    Family History  Problem Relation Age of Onset  . Family history unknown: Yes    Health Maintenance  Topic Date Due  . TETANUS/TDAP  09/28/2016 (Originally 01/01/1938)  . PNA vac Low Risk Adult (1 of 2 - PCV13) 09/28/2016 (Originally 12/31/1983)  . DEXA SCAN  03/21/2017 (Originally 12/31/1983)  . INFLUENZA VACCINE  08/23/2016    No Known Allergies  Allergies as of 05/03/2016   No Known Allergies     Medication List       Accurate as of 05/03/16 10:33 AM. Always use your most recent med list.          acetaminophen 325 MG tablet Commonly known as:  TYLENOL Take 650 mg by mouth every 6 (six) hours as needed for mild pain.   bisacodyl 10 MG suppository Commonly known as:  DULCOLAX Place 1 suppository (10 mg total)  rectally daily.   docusate sodium 100 MG capsule Commonly known as:  COLACE Take 100 mg by mouth 2 (two) times daily.   LORazepam 0.5 MG tablet Commonly known as:  ATIVAN Take 0.5 mg by mouth every 4 (four) hours as needed for anxiety.   memantine 10 MG tablet Commonly known as:  NAMENDA Take 10 mg by mouth 2 (two) times daily.   mirtazapine 15 MG tablet Commonly known as:  REMERON Take 15 mg by mouth at bedtime.   morphine 20 MG/5ML solution Take 5 mg by mouth every 4 (four) hours as needed for pain.   polyethylene glycol packet Commonly  known as:  MIRALAX / GLYCOLAX Take 17 g by mouth daily.   senna 8.6 MG tablet Commonly known as:  SENOKOT Take 1 tablet by mouth 2 (two) times daily.       Review of Systems  Unable to perform ROS: Dementia    Vitals:   05/03/16 1012  BP: 106/76  Pulse: 86  Resp: 12  Temp: (!) 95.5 F (35.3 C)  TempSrc: Oral  SpO2: 94%   There is no height or weight on file to calculate BMI. Physical Exam  Constitutional: She appears cachectic.  HENT:  Head: Normocephalic.  Mouth/Throat: Oropharynx is clear and moist.  Eyes: Pupils are equal, round, and reactive to light.  Neck: Neck supple.  Cardiovascular: Normal rate, regular rhythm and normal heart sounds.   No murmur heard. Pulmonary/Chest: Effort normal and breath sounds normal. No respiratory distress. She has no wheezes. She has no rales.  Abdominal: Soft. Bowel sounds are normal. She exhibits no distension. There is no tenderness. There is no rebound.  Musculoskeletal: She exhibits no edema.  Neurological: She is alert.  Not able to do detail Exam.  Skin: Skin is warm and dry.  Psychiatric:  Cannot Examine.    Labs reviewed: Basic Metabolic Panel:  Recent Labs  16/10/96 1555 04/03/16 0314 04/04/16 0712  NA 166* 160* 147*  K 4.2 3.2* 3.8  CL 129* 125* 119*  CO2 GLUCOSE 148* 183* 131*  BUN 33* 26* 12  CREATININE 0.90 0.73 0.63  CALCIUM 8.6* 8.0* 7.6*  MG  --   --  2.3   Liver Function Tests:  Recent Labs  08/27/15 10/27/15 04/02/16 1555  AST ALT ALKPHOS 61 59 70  BILITOT  --   --  0.5  PROT  --   --  6.0*  ALBUMIN  --   --  2.5*   No results for input(s): LIPASE, AMYLASE in the last 8760 hours. No results for input(s): AMMONIA in the last 8760 hours. CBC:  Recent Labs  08/27/15 04/02/16 1555 04/03/16 0314  WBC 7.1 9.2 8.7  NEUTROABS  --  8.1*  --   HGB 11.0* 10.1* 8.7*  HCT 37 35.2* 29.8*  MCV  --  84.6 84.2  PLT 189 126* 102*   Cardiac Enzymes: No results for  input(s): CKTOTAL, CKMB, CKMBINDEX, TROPONINI in the last 8760 hours. BNP: Invalid input(s): POCBNP No results found for: HGBA1C Lab Results  Component Value Date   TSH 2.301 04/02/2016   No results found for: VITAMINB12 No results found for: FOLATE No results found for: IRON, TIBC, FERRITIN  Imaging and Procedures obtained prior to SNF admission: Dg Chest 2 View  Result Date: 04/02/2016 CLINICAL DATA:  Altered mental status.  Fever. EXAM: CHEST  2 VIEW COMPARISON:  Two-view chest x-ray 03/12/2015.  FINDINGS: The heart is mildly enlarged. Atherosclerotic calcifications are present at the aorta. Right lower and upper lobe pneumonia on a. Left lung is clear. Chronic interstitial coarsening is present. A nodular density in the left upper lobe is stable. Exaggerated kyphosis is present in the upper thoracic spine. IMPRESSION: 1. Right lower and probable upper lobe pneumonia is new. Electronically Signed   By: Marin Roberts M.D.   On: 04/02/2016 16:39   Ct Head Wo Contrast  Result Date: 04/02/2016 CLINICAL DATA:  Fever.  Declining mental status. EXAM: CT HEAD WITHOUT CONTRAST TECHNIQUE: Contiguous axial images were obtained from the base of the skull through the vertex without intravenous contrast. COMPARISON:  02/18/2013 FINDINGS: Brain: Diffuse cerebral atrophy. Mild ventricular dilatation consistent with central atrophy. Low-attenuation changes throughout the deep white matter consistent with small vessel ischemia. No focal lesions or collections are identified. No abnormal extra-axial fluid collections. Gray-white matter junctions are distinct. Basal cisterns are not effaced. Vascular: Vascular calcifications are present throughout the carotid siphons and vertebrobasilar vessels. Skull: Calvarium appears intact. Sinuses/Orbits: No acute finding. Other: No significant changes since previous study. IMPRESSION: Chronic atrophy and small vessel ischemic changes. No acute intracranial  abnormalities. Electronically Signed   By: Burman Nieves M.D.   On: 04/02/2016 21:59    Assessment/Plan Patient with Failure to thrive, Aspiration pneumonia,Hypernatremia, And end stage dementia. Will continue comfort care and supportive care. No transfer to hospital no blood work.     Family/ staff Communication:   Labs/tests ordered:

## 2016-05-08 ENCOUNTER — Other Ambulatory Visit: Payer: Self-pay

## 2016-05-23 DEATH — deceased

## 2017-10-02 IMAGING — CT CT PELVIS W/O CM
2 of 4 series · 16 of 46 positions shown, 18 images · non-contrast
Comparison: Plain films earlier today.

CLINICAL DATA: Fall going to the bathroom yesterday.  Pain.

EXAM:
CT PELVIS WITHOUT CONTRAST
TECHNIQUE: Multidetector CT imaging of the pelvis was performed following the
standard protocol without intravenous contrast.

[Series 4: pelvis wo · axial · 0.57mm/px · z∈[+940,+1105]mm · 13 of 38 slices shown, 15 images]
[im 3/38  soft-tissue]
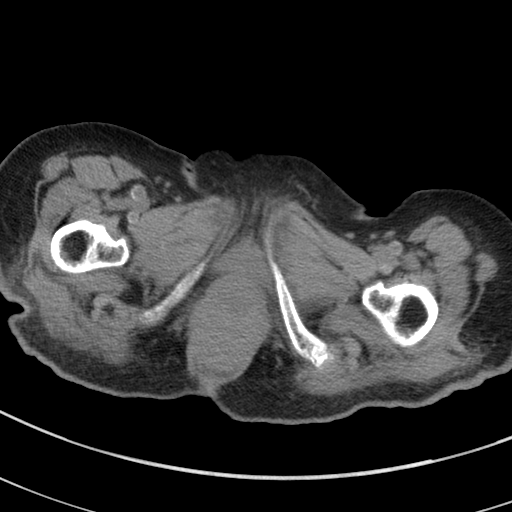
[im 3/38  bone]
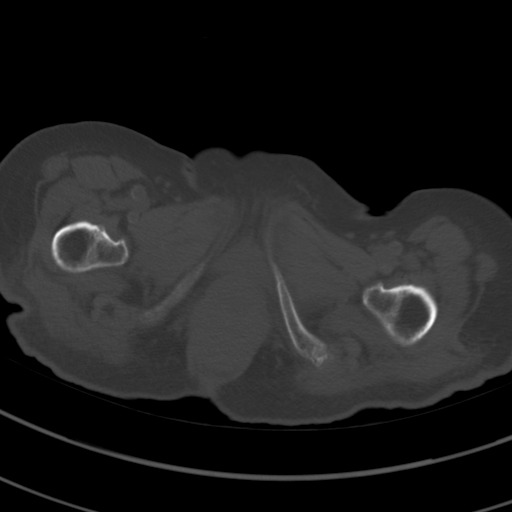
[im 6/38  soft-tissue]
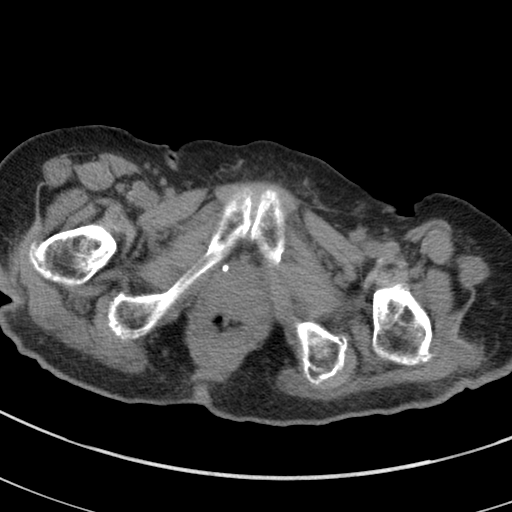
[im 9/38  soft-tissue]
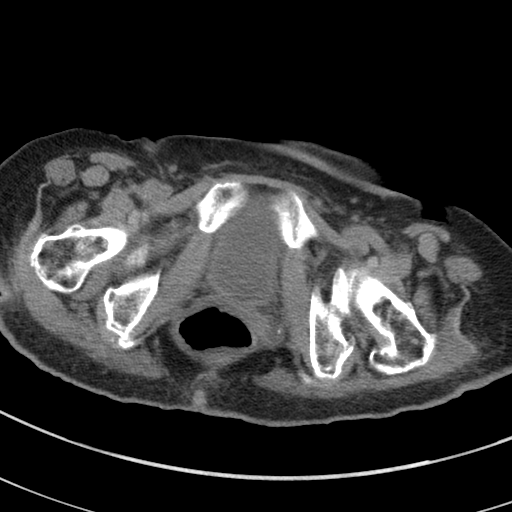
[im 11/38  soft-tissue]
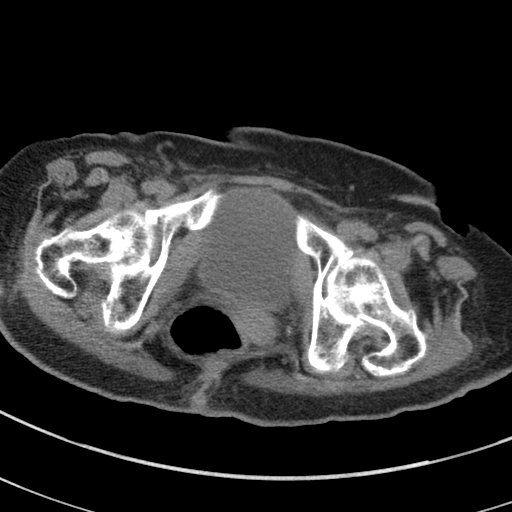
[im 14/38  soft-tissue]
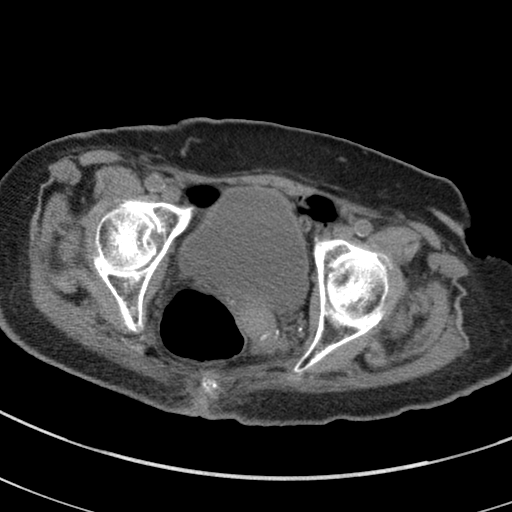
[im 17/38  soft-tissue]
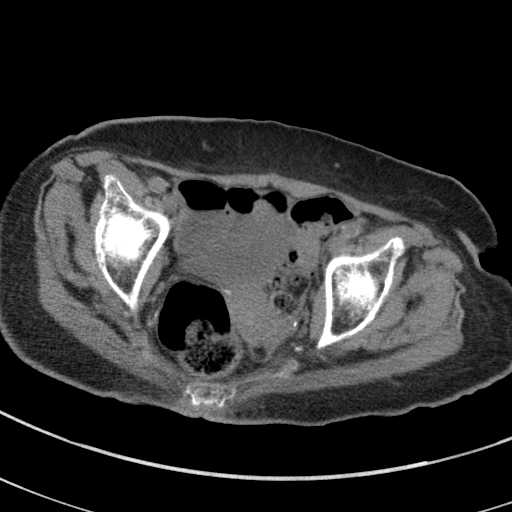
[im 20/38  soft-tissue]
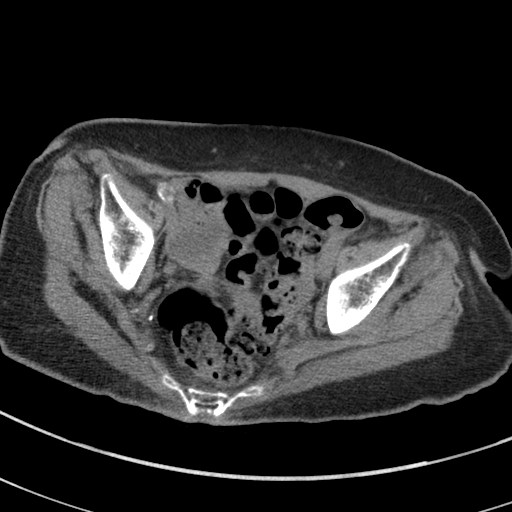
[im 22/38  soft-tissue]
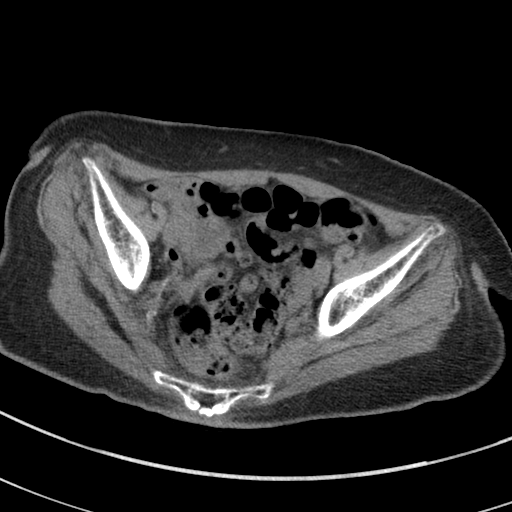
[im 25/38  soft-tissue]
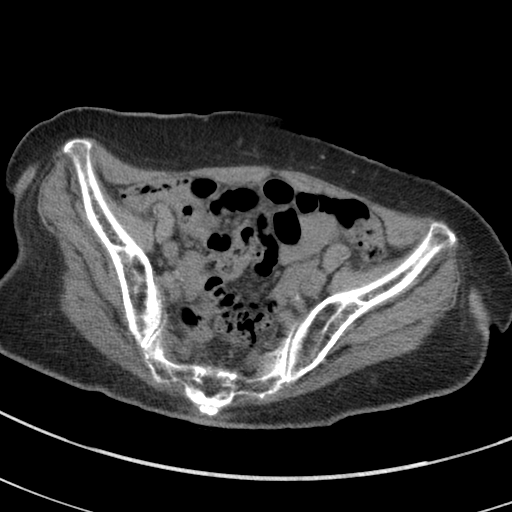
[im 25/38  bone]
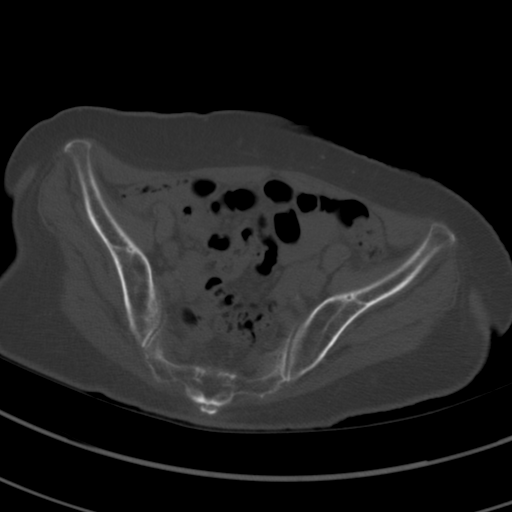
[im 28/38  soft-tissue]
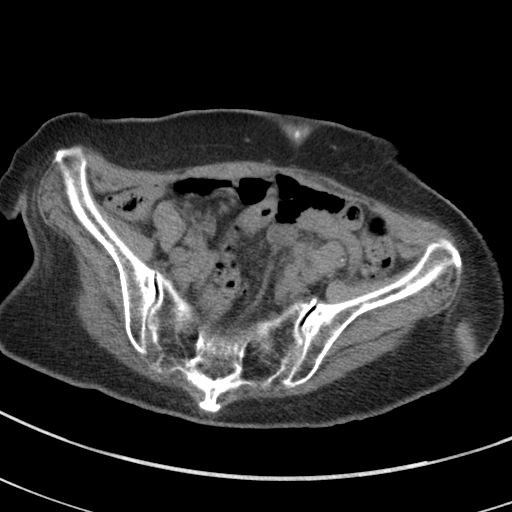
[im 31/38  soft-tissue]
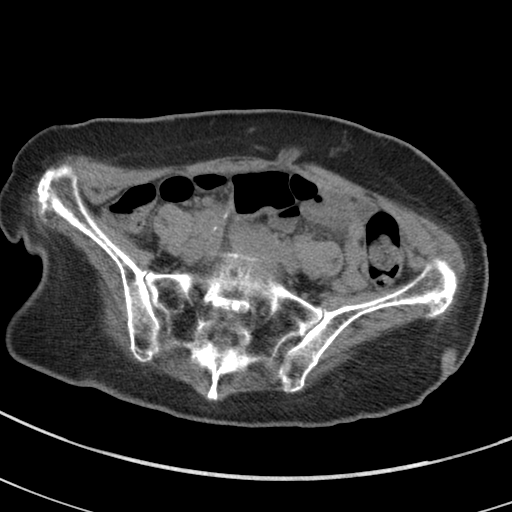
[im 33/38  soft-tissue]
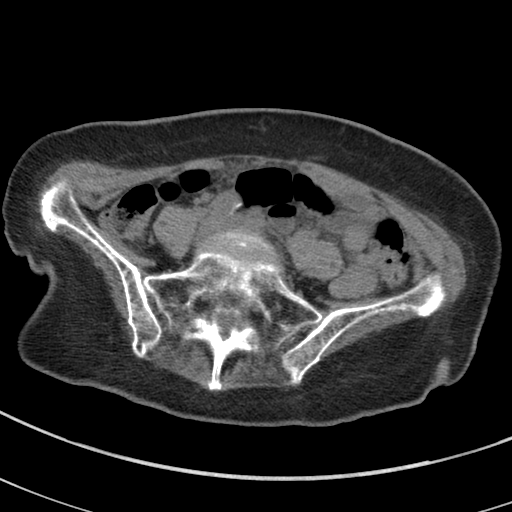
[im 36/38  soft-tissue]
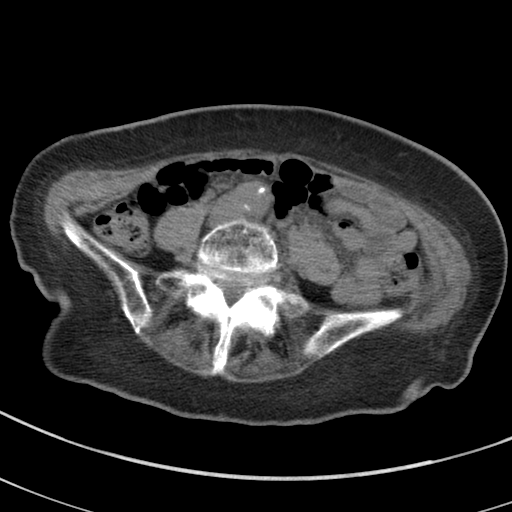

[Series 6: coronal images · coronal · 0.46mm/px · 3 of 81 slices shown]
[im 27/81  soft-tissue]
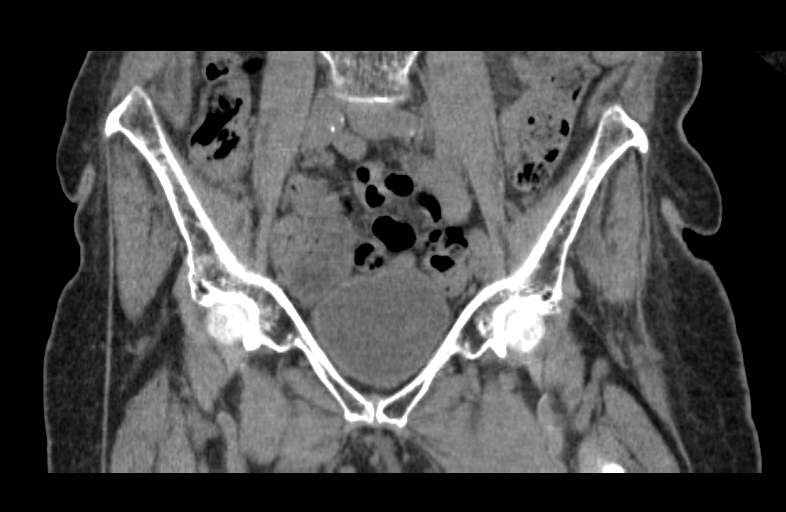
[im 36/81  soft-tissue]
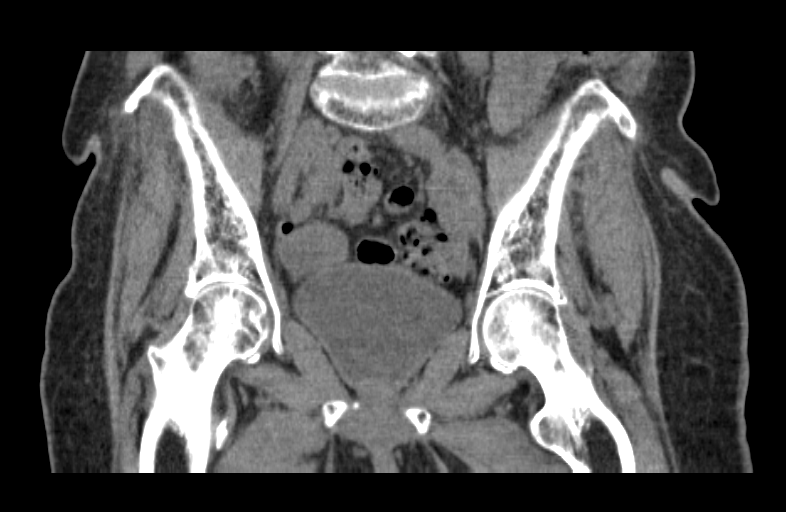
[im 45/81  soft-tissue]
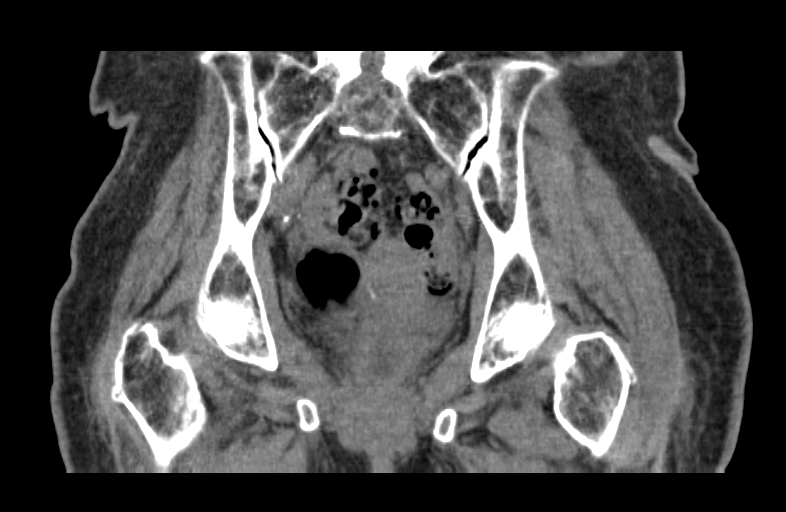

[16 of 46 positions shown; findings below may reference images not displayed]

FINDINGS: No acute bony abnormality. No evidence of pelvic fracture. In
particular, the area questioned on plain films in the right superior
pubic ramus is intact. Mild joint space narrowing in the hips
bilaterally. No proximal femoral abnormality.
IMPRESSION: No evidence of pelvic fracture.
